# Patient Record
Sex: Female | Born: 1975 | Race: White | Hispanic: Yes | Marital: Married | Smoking: Never smoker
Health system: Southern US, Community
[De-identification: ages and names within clinical notes are randomized; demographics above are authoritative.]

## PROBLEM LIST (undated history)

## (undated) DIAGNOSIS — G43909 Migraine, unspecified, not intractable, without status migrainosus: Secondary | ICD-10-CM

## (undated) DIAGNOSIS — F419 Anxiety disorder, unspecified: Secondary | ICD-10-CM

## (undated) DIAGNOSIS — F329 Major depressive disorder, single episode, unspecified: Secondary | ICD-10-CM

## (undated) DIAGNOSIS — F32A Depression, unspecified: Secondary | ICD-10-CM

## (undated) HISTORY — DX: Anxiety disorder, unspecified: F41.9

## (undated) HISTORY — DX: Major depressive disorder, single episode, unspecified: F32.9

## (undated) HISTORY — DX: Migraine, unspecified, not intractable, without status migrainosus: G43.909

## (undated) HISTORY — DX: Depression, unspecified: F32.A

---

## 1999-05-26 ENCOUNTER — Other Ambulatory Visit: Admission: RE | Admit: 1999-05-26 | Discharge: 1999-05-26 | Payer: Self-pay | Admitting: Gynecology

## 2000-05-27 ENCOUNTER — Other Ambulatory Visit: Admission: RE | Admit: 2000-05-27 | Discharge: 2000-05-27 | Payer: Self-pay | Admitting: Gynecology

## 2001-06-10 ENCOUNTER — Other Ambulatory Visit: Admission: RE | Admit: 2001-06-10 | Discharge: 2001-06-10 | Payer: Self-pay | Admitting: Gynecology

## 2002-06-12 ENCOUNTER — Other Ambulatory Visit: Admission: RE | Admit: 2002-06-12 | Discharge: 2002-06-12 | Payer: Self-pay | Admitting: Gynecology

## 2003-08-19 ENCOUNTER — Other Ambulatory Visit: Admission: RE | Admit: 2003-08-19 | Discharge: 2003-08-19 | Payer: Self-pay | Admitting: Gynecology

## 2004-03-11 ENCOUNTER — Inpatient Hospital Stay (HOSPITAL_COMMUNITY): Admission: AD | Admit: 2004-03-11 | Discharge: 2004-03-13 | Payer: Self-pay | Admitting: Gynecology

## 2004-04-24 ENCOUNTER — Other Ambulatory Visit: Admission: RE | Admit: 2004-04-24 | Discharge: 2004-04-24 | Payer: Self-pay | Admitting: Gynecology

## 2005-05-03 ENCOUNTER — Other Ambulatory Visit: Admission: RE | Admit: 2005-05-03 | Discharge: 2005-05-03 | Payer: Self-pay | Admitting: Gynecology

## 2006-02-21 ENCOUNTER — Inpatient Hospital Stay (HOSPITAL_COMMUNITY): Admission: AD | Admit: 2006-02-21 | Discharge: 2006-02-23 | Payer: Self-pay | Admitting: Gynecology

## 2006-04-03 ENCOUNTER — Other Ambulatory Visit: Admission: RE | Admit: 2006-04-03 | Discharge: 2006-04-03 | Payer: Self-pay | Admitting: Gynecology

## 2007-04-08 ENCOUNTER — Other Ambulatory Visit: Admission: RE | Admit: 2007-04-08 | Discharge: 2007-04-08 | Payer: Self-pay | Admitting: Gynecology

## 2008-04-08 ENCOUNTER — Other Ambulatory Visit: Admission: RE | Admit: 2008-04-08 | Discharge: 2008-04-08 | Payer: Self-pay | Admitting: Gynecology

## 2008-04-08 ENCOUNTER — Encounter: Payer: Self-pay | Admitting: Gynecology

## 2008-04-08 ENCOUNTER — Ambulatory Visit: Payer: Self-pay | Admitting: Gynecology

## 2008-04-22 ENCOUNTER — Ambulatory Visit: Payer: Self-pay | Admitting: Gynecology

## 2008-05-07 ENCOUNTER — Ambulatory Visit: Payer: Self-pay | Admitting: Gynecology

## 2008-10-28 ENCOUNTER — Ambulatory Visit: Payer: Self-pay | Admitting: Gynecology

## 2009-04-15 ENCOUNTER — Ambulatory Visit: Payer: Self-pay | Admitting: Gynecology

## 2009-04-15 ENCOUNTER — Other Ambulatory Visit: Admission: RE | Admit: 2009-04-15 | Discharge: 2009-04-15 | Payer: Self-pay | Admitting: Gynecology

## 2009-04-22 ENCOUNTER — Ambulatory Visit: Payer: Self-pay | Admitting: Gynecology

## 2010-04-17 ENCOUNTER — Ambulatory Visit
Admission: RE | Admit: 2010-04-17 | Discharge: 2010-04-17 | Payer: Self-pay | Source: Home / Self Care | Attending: Gynecology | Admitting: Gynecology

## 2010-04-17 ENCOUNTER — Other Ambulatory Visit
Admission: RE | Admit: 2010-04-17 | Discharge: 2010-04-17 | Payer: Self-pay | Source: Home / Self Care | Admitting: Gynecology

## 2010-08-15 ENCOUNTER — Ambulatory Visit (INDEPENDENT_AMBULATORY_CARE_PROVIDER_SITE_OTHER): Payer: 59 | Admitting: Gynecology

## 2010-08-15 DIAGNOSIS — Z302 Encounter for sterilization: Secondary | ICD-10-CM

## 2010-08-15 DIAGNOSIS — Z3049 Encounter for surveillance of other contraceptives: Secondary | ICD-10-CM

## 2010-08-16 ENCOUNTER — Ambulatory Visit: Payer: Self-pay | Admitting: Gynecology

## 2010-08-25 NOTE — H&P (Signed)
[name], [name] NO.:  [id number]   MEDICAL RECORD NO.:  [id number]          PATIENT TYPE:  MAT   LOCATION:  [facility]                          FACILITY:  [facility]   PHYSICIAN:  Timothy P. Fontaine, M.D.DATE OF BIRTH:  05-Feb-1976   DATE OF ADMISSION:  02/21/2006  DATE OF DISCHARGE:                                HISTORY & PHYSICAL   CHIEF COMPLAINT:  Labor.   HISTORY OF PRESENT ILLNESS:  A 35 year old G75 P2 female at [redacted] weeks  gestation, who presents complaining of contractions.  Prenatal course has  been uncomplicated, other than she is a positive beta Streptococcus carrier.  For remainder of her history, see her [facility].   PHYSICAL EXAMINATION:  HEENT:  Normal.  LUNGS:  Clear.  CARDIAC:  Regular rate, without rubs, murmurs, or gallops.  ABDOMEN:  Gravid, vertex term uterus.  External monitor showed reactive  fetal tracing without decelerations.  Contractions every 5 minutes.  PELVIC:  Per nursing personnel, 80%, bulging bag, 4-5 cm dilated, posterior,  intact.   ASSESSMENT AND PLAN:  A 35 year old G71 P2 female, term gestation, labor.  Positive beta Streptococcus.   PLAN ON ADMISSION:  Antibiotic prophylaxis.  AROM.  Delivery.      Timothy P. Fontaine, M.D.  Electronically Signed     TPF/MEDQ  D:  02/21/2006  T:  02/21/2006  Job:  [id number]

## 2011-02-12 ENCOUNTER — Encounter: Payer: Self-pay | Admitting: Anesthesiology

## 2011-02-13 ENCOUNTER — Encounter: Payer: Self-pay | Admitting: Gynecology

## 2011-02-13 ENCOUNTER — Ambulatory Visit (INDEPENDENT_AMBULATORY_CARE_PROVIDER_SITE_OTHER): Payer: 59 | Admitting: Gynecology

## 2011-02-13 VITALS — BP 120/82

## 2011-02-13 DIAGNOSIS — N915 Oligomenorrhea, unspecified: Secondary | ICD-10-CM

## 2011-02-13 DIAGNOSIS — Z309 Encounter for contraceptive management, unspecified: Secondary | ICD-10-CM

## 2011-02-13 DIAGNOSIS — L299 Pruritus, unspecified: Secondary | ICD-10-CM

## 2011-02-13 DIAGNOSIS — T782XXA Anaphylactic shock, unspecified, initial encounter: Secondary | ICD-10-CM

## 2011-02-13 NOTE — Progress Notes (Signed)
The patient is a 35 year old gravida 3 para 3 who presented to the office today with irregular again her menstrual cycle and complaining of generalized pruritus especially in the arms or on the breast in her lower extremities which she stated started a month ago. She has been seen in the office on May 8 of this year and wanted to go on oral contraceptive pill since she was having mucus-like secretions when she had a Mirena IUD. She is no longer suffering from this mucus secretion vaginally. We had discussed several contraceptive options in the past include laparoscopic tubal ligation for which she was leaning towards proceeding with abdomen and antrum was going to try the oral contraceptive pill after she was counseled. 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 She started the Janel 120 in July 2 did for one month was waiting for her cycle to start did not take a pill at August the started in September 2 did for one month and had a. Early October and did not start the pill again and began having with a rash a month and a half ago.  Exam: Red erythematous papules were noted in the upper extremities and lower extremities and excoriated areas from scratching. No lesions were noted on the breast.  Assessment: Anaphylactic reaction to Janel 1/20 oral contraceptive pill and/or contact dermatitis from plastics she works with at work. She will stop the oral contraceptive pills. She scheduled to return January for her annual exam. I've given her literature information on the nonhormonal IUD as well as on laparoscopic sterilization. She will use barrier contraception. She will be placed on a 12 day course of steroid of 10 mg. She'll be started also on Vistaril consisting of 25 mg 3 times a day when necessary.

## 2011-02-14 ENCOUNTER — Telehealth: Payer: Self-pay | Admitting: *Deleted

## 2011-02-14 MED ORDER — HYDROXYZINE HCL 25 MG PO TABS: 25.0000 mg | ORAL_TABLET | Freq: Three times a day (TID) | ORAL | Status: AC | PRN

## 2011-02-14 NOTE — Telephone Encounter (Signed)
(  Last office visit 02/13/11) Pharmacy called regarding this pt has a rx for vistaril 25 mg 3 times a day. What amount of pills should pt have? The medication was not sent via e-scribe. Please advise

## 2011-02-14 NOTE — Telephone Encounter (Signed)
Spoke with pharmacy regarding the below medication. Placed medication in meds & orders

## 2011-02-14 NOTE — Telephone Encounter (Signed)
The patient was seen in the office yesterday and had an anaphylactic reaction from the [facility] 1/20 oral contraceptive pill. She was started on a 12 day steroid pack along with Vistaril 25 mg 3 times a day when necessary. Pharmacy called and not stated quantity of the Vistaril and this is to document that well as them to dispense 20 tablets with no refills.

## 2011-05-04 ENCOUNTER — Encounter: Payer: 59 | Admitting: Gynecology

## 2011-05-08 ENCOUNTER — Ambulatory Visit (INDEPENDENT_AMBULATORY_CARE_PROVIDER_SITE_OTHER): Payer: 59 | Admitting: Gynecology

## 2011-05-08 ENCOUNTER — Encounter: Payer: Self-pay | Admitting: Gynecology

## 2011-05-08 VITALS — BP 116/70 | Ht 61.5 in | Wt 141.0 lb

## 2011-05-08 DIAGNOSIS — Z01419 Encounter for gynecological examination (general) (routine) without abnormal findings: Secondary | ICD-10-CM

## 2011-05-08 DIAGNOSIS — R82998 Other abnormal findings in urine: Secondary | ICD-10-CM

## 2011-05-08 DIAGNOSIS — IMO0001 Reserved for inherently not codable concepts without codable children: Secondary | ICD-10-CM

## 2011-05-08 DIAGNOSIS — R635 Abnormal weight gain: Secondary | ICD-10-CM

## 2011-05-08 DIAGNOSIS — Z309 Encounter for contraceptive management, unspecified: Secondary | ICD-10-CM

## 2011-05-08 LAB — URINALYSIS W MICROSCOPIC + REFLEX CULTURE
Glucose, UA: NEGATIVE mg/dL
Leukocytes, UA: NEGATIVE
Nitrite: NEGATIVE
Protein, ur: NEGATIVE mg/dL

## 2011-05-08 LAB — CBC WITH DIFFERENTIAL/PLATELET
Basophils Relative: 0 % (ref 0–1)
Hemoglobin: 13.9 g/dL (ref 12.0–15.0)
Lymphs Abs: 1.6 10*3/uL (ref 0.7–4.0)
MCHC: 34.2 g/dL (ref 30.0–36.0)
Monocytes Relative: 9 % (ref 3–12)
Neutro Abs: 3.7 10*3/uL (ref 1.7–7.7)
Neutrophils Relative %: 62 % (ref 43–77)
Platelets: 207 10*3/uL (ref 150–400)
RBC: 4.69 MIL/uL (ref 3.87–5.11)

## 2011-05-08 LAB — CHOLESTEROL, TOTAL: Cholesterol: 217 mg/dL — ABNORMAL HIGH (ref 0–200)

## 2011-05-08 NOTE — Progress Notes (Signed)
Cheryl [name] [birth year] [id number]   History:    36 y.o.  for annual exam with the only complaining of occasional nonspecific dermatitis at the crease of her elbows and lower legs. She states that she works in a cold environment and periodically has to wear a layered clothing. Aveeno cream has worked well. Patient does her monthly self breast examination. She was weighing 139 is up to 141 with a BMI of 26.21. She is using condoms for contraception is having normal menstrual cycles.  Past medical history,surgical history, family history and social history were all reviewed and documented in the EPIC chart.  Gynecologic History Patient's last menstrual period was 04/29/2011. Contraception: condoms Last Pap: 2012. Results were: normal Last mammogram: No prior study. Results were: No prior study  Obstetric History OB History    Grav Para Term Preterm Abortions TAB SAB Ect Mult Living   3 3 3       3      # Outc Date GA Lbr Len/2nd Wgt Sex Del Anes PTL Lv   1 TRM     F SVD   Yes   2 TRM     M SVD   Yes   3 TRM     M SVD   Yes       ROS:  Was performed and pertinent positives and negatives are included in the history.  Exam: chaperone present  BP 116/70  Ht 5' 1.5" (1.562 m)  Wt 141 lb (63.957 kg)  BMI 26.21 kg/m2  LMP 04/29/2011  Body mass index is 26.21 kg/(m^2).  General appearance : Well developed well nourished female. No acute distress HEENT: Neck supple, trachea midline, no carotid bruits, no thyroidmegaly Lungs: Clear to auscultation, no rhonchi or wheezes, or rib retractions  Heart: Regular rate and rhythm, no murmurs or gallops Breast:Examined in sitting and supine position were symmetrical in appearance, no palpable masses or tenderness,  no skin retraction, no nipple inversion, no nipple discharge, no skin discoloration, no axillary or supraclavicular lymphadenopathy Abdomen: no palpable masses or tenderness, no rebound or guarding Extremities: no edema or skin  discoloration or tenderness  Pelvic:  Bartholin, Urethra, Skene Glands: Within normal limits             Vagina: No gross lesions or discharge  Cervix: No gross lesions or discharge  Uterus  anteverted, normal size, shape and consistency, non-tender and mobile  Adnexa  Without masses or tenderness  Anus and perineum  normal   Rectovaginal  normal sphincter tone without palpated masses or tenderness             Hemoccult not done     Assessment/Plan:  36 y.o. female for annual exam unremarkable. Literature information on diet and exercise provided in Spanish. The following labs will be drawn: CBC, cholesterol, blood sugar, TSH, urinalysis. New screening guidelines for Pap smear discussed. No Pap smear done today.    Ok Edwards MD, 3:50 PM 05/08/2011

## 2011-05-08 NOTE — Patient Instructions (Signed)
Control del colesterol  Los niveles de colesterol en el organismo estn determinados significativamente por su dieta. Los niveles de colesterol tambin se relacionan con la enfermedad cardaca. El material que sigue ayuda a explicar esta relacin y a analizar qu puede hacer para mantener su corazn sano. No todo el colesterol es malo. Las lipoprotenas de baja densidad (LDL) forman el colesterol "malo". El colesterol malo puede ocasionar depsitos de grasa que se acumulan en el interior de las arterias. Las lipoprotenas de alta densidad (HDL) es el colesterol "bueno". Ayuda a remover el colesterol LDL "malo" de la sangre. El colesterol es un factor de riesgo muy importante para la enfermedad cardaca. Otros factores de riesgo son la hipertensin arterial, el hbito de fumar, el estrs, la herencia y el peso.   El msculo cardaco obtiene el suministro de sangre a travs de las arterias coronarias. Si su colesterol LDL ("malo") est elevado y el HDL ("bueno") es bajo, tiene un factor de riesgo para que se formen depsitos de grasa en las arterias coronarias (los vasos sanguneos que suministran sangre al corazn). Esto hace que haya menos lugar para que la sangre circule. Sin la suficiente sangre y oxgeno, el msculo cardaco no puede funcionar correctamente, y usted podr sentir dolores en el pecho (angina pectoris). Cuando una arteria coronaria se cierra completamente, una parte del msculo cardaco puede morir (infarto de miocardio).  CONTROL DEL COLESTEROL Cuando el profesional que lo asiste enva la sangre al laboratorio para conocer el nivel de colesterol, puede realizarle tambin un perfil completo de los lpidos. Con esta prueba, se puede determinar la cantidad total de colesterol, as como los niveles de LDL y HDL. Los triglicridos son un tipo de grasa que circula en la sangre y que tambin puede utilizarse para determinar el riesgo de enfermedad  cardaca. En la siguiente tabla se establecen los nmeros ideales: Prueba: Colesterol total  Menos de 200 mg/dl.  Prueba: LDL "colesterol malo"  Menos de 100 mg/dl.   Menos de 70 mg/dl si tiene riesgo muy elevado de sufrir un ataque cardaco o muerte cardaca sbita.  Prueba: HDL "colesterol bueno"  Mujeres: Ms de 50 mg/dl.   Hombres: Ms de 40 mg/dl.  Prueba: Trigliceridos  Menos de 150 mg/dl.    CONTROL DEL COLESTEROL CON DIETA Aunque factores como el ejercicio y el estilo de vida son importantes, la "primera lnea de ataque" es la dieta. Esto se debe a que se sabe que ciertos alimentos hacen subir el colesterol y otros lo bajan. El objetivo debe ser equilibrar los alimentos, de modo que tengan un efecto sobre el colesterol y, an ms importante, reemplazar las grasas saturadas y trans con otros tipos de grasas, como las monoinsaturadas y las poliinsaturadas y cidos grasos omega-3 . En promedio, una persona no debe consumir ms de 15 a 17 g de grasas saturadas por da. Las grasas saturadas y trans se consideran grasas "malas", ya que elevan el colesterol LDL. Las grasas saturadas se encuentran principalmente en productos animales como carne, manteca y crema. Pero esto no significa que usted debe sacrificar todas sus comidas favoritas. Actualmente, como lo muestra el cuadro que figura al final de este documento, hay sustitutos de buen sabor, bajos en grasas y en colesterol, para la mayora de los alimentos que a usted le gusta comer. Elija aquellos alimentos alternativos que sean bajos en grasas o sin grasas. Elija cortes de carne del cuarto trasero o lomo ya que estos cortes son los que tienen menor cantidad de grasa   y colesterol. El pollo (sin piel), el pescado, la carne de ternera, y la pechuga de pavo molida son excelentes opciones. Elimine las carnes grasosas como los hotdogs o el salami. Los mariscos tienen poco o nada de grasas saturadas. Cuando consuma carne magra, carne de aves de  corral, o pescado, hgalo en porciones de 85 gramos (3 onzas). Las grasas trans tambin se llaman "aceites parcialmente hidrogenados". Son aceites manipulados cientficamente de modo que son slidos a temperatura ambiente, tienen una larga vida y mejoran el sabor y la textura de los alimentos a los que se agregan. Las grasas trans se encuentran en la margarina, masitas, crackers y alimentos horneados.  Para hornear y cocinar, el aceite es un excelente sustituto para la mantequilla. Los aceites monoinsaturados tienen un beneficio particular, ya que se cree que disminuyen el colesterol LDL (colesterol malo) y elevan el HDL. Deber evitar los aceites tropicales saturados como el de coco y el de palma.  Recuerde, adems, que puede comer sin restricciones los grupos de alimentos que son naturalmente libres de grasas saturadas y grasas trans, entre los que se incluyen el pescado, las frutas (excepto el aguacate), verduras, frijoles, cereales (cebada, arroz, cuzcuz, trigo) y las pastas (sin salsas con crema)   IDENTIFIQUE LOS ALIMENTOS QUE DISMINUYEN EL COLESTEROL  Pueden disminuir el colesterol las fibras solubles que estn en las frutas, como las manzanas, en los vegetales como el brcoli, las patatas y las zanahorias; en las legumbres como frijoles, guisantes y lentejas; y en los cereales como la cebada. Los alimentos fortificados con fitosteroles tambin pueden disminuir el colesterol. Debe consumir al menos 2 g de estos alimentos a diario para obtener el efecto de disminucin de colesterol.  En el supermercado, lea las etiquetas de los envases para identificar los alimentos bajos en grasas saturadas, libres de grasas trans y bajos en grasas, . Elija quesos que tengan solo de 2 a 3 g de grasa saturada por onza (28,35 g). Use una margarina que no dae el corazn, libre de grasas trans o aceite parcialmente hidrogenado. Al comprar alimentos horneados (galletitas dulces y galletas) evite el aceite parcialmente  hidrogenado. Los panes y bollos debern ser de granos enteros (harina de maz o de avena entera, en lugar de "harina" o "harina enriquecida"). Compre sopas en lata que no sean cremosas, con bajo contenido de sal y sin grasas adicionadas.   TCNICAS DE PREPARACIN DE LOS ALIMENTOS  Nunca fra los alimentos en aceite abundante. Si debe frer, hgalo en poco aceite y removiendo constantemente, porque as se utilizan muy pocas grasas, o utilice un spray antiadherente. Cuando le sea posible, hierva, hornee o ase las carnes y cocine los vegetales al vapor. En vez de aderezar los vegetales con mantequilla o margarina, utilice limn y hierbas, pur de manzanas y canela (para las calabazas y batatas), yogurt y salsa descremados y aderezos para ensaladas bajos en contenido graso.   BAJO EN GRASAS SATURADAS / SUSTITUTOS BAJOS EN GRASA  Carnes / Grasas saturadas (g)  Evite: Bife, corte graso (3 oz/85 g) / 11 g   Elija: Bife, corte magro (3 oz/85 g) / 4 g   Evite: Hamburguesa (3 oz/85 g) / 7 g   Elija:  [place]amburguesa magra (3 oz/85 g) / 5 g   Evite: Jamn (3 oz/85 g) / 6 g   Elija:  Jamn magro (3 oz/85 g) / 2.4 g   Evite: Pollo, con piel (3 oz/85 g), Carne oscura / 4 g   Elija:  Pollo, sin piel (  3 oz/85 g), Carne oscura / 2 g   Evite: Pollo, con piel (3 oz/85 g), Carne magra / 2.5 g   Elija: Pollo, sin piel (3 oz/85 g), Carne magra / 1 g  Lcteos / Grasas saturadas (g)  Evite: Leche entera (1 taza) / 5 g   Elija: Leche con bajo contenido de grasa, 2% (1 taza) / 3 g   Elija: Leche con bajo contenido de grasa, 1% (1 taza) / 1.5 g   Elija: Leche descremada (1 taza) / 0.3 g   Evite: Queso duro (1 oz/28 g) / 6 g   Elija: Queso descremado (1 oz/28 g) / 2-3 g   Evite: Queso cottage, 4% grasa (1 taza)/ 6.5 g   Elija: Queso cottage con bajo contenido de grasa, 1% grasa (1 taza)/ 1.5 g   Evite: Helado (1 taza) / 9 g   Elija: Sorbete (1 taza) / 2.5 g   Elija: Yogurt helado sin contenido de  grasa (1 taza) / 0.3 g   Elija: Barras de fruta congeladas / vestigios   Evite: Crema batida (1 cucharada) / 3.5 g   Elija: Batidos glac sin lcteos (1 cucharada) / 1 g  Condimentos / Grasas saturadas (g)  Evite: Mayonesa (1 cucharada) / 2 g   Elija: Mayonesa con bajo contenido de grasa (1 cucharada) / 1 g   Evite: Manteca (1 cucharada) / 7 g   Elija: Margarina extra light (1 cucharada) / 1 g   Evite: Aceite de coco (1 cucharada) / 11.8 g   Elija: Aceite de oliva (1 cucharada) / 1.8 g   Elija: Aceite de maz (1 cucharada) / 1.7 g   Elija: Aceite de crtamo (1 cucharada) / 1.2 g   Elija: Aceite de girasol (1 cucharada) / 1.4 g   Elija: Aceite de soja (1 cucharada) / 2.4 g   Elija: Aceite de canola (1 cucharada) / 1 g  Document Released: 03/26/2005 Document Revised: 12/06/2010 [employer] Patient Information 2012 [employer]. Ejercicios para perder peso (Exercise to Lose Weight) La actividad fsica y una dieta saludable ayudan a perder peso. El mdico podr sugerirle ejercicios especficos. IDEAS Y CONSEJOS PARA HACER EJERCICIOS  Elija opciones econmicas que disfrute hacer , como caminar, andar en bicicleta o los vdeos para ejercitarse.   Utilice las escaleras en lugar del ascensor.   Camine durante la hora del almuerzo.   Estacione el auto lejos del lugar de trabajo o estudio.   Concurra a un gimnasio o tome clases de gimnasia.   Comience con 5  10 minutos de actividad fsica por da. Ejercite hasta 30 minutos, 4 a 6 das por semana.   Utilice zapatos que tengan un buen soporte y ropas cmodas.   Elongue antes y despus de ejercitar.   Ejercite hasta que aumente la respiracin y el corazn palpite rpido.   Beba agua extra cuando ejercite.   No haga ejercicio hasta lastimarse, sentirse mareado o que le falte mucho el aire.  La actividad fsica puede quemar alrededor de 150 caloras.  Correr 20 cuadras en 15 minutos.   Jugar vley durante 45 a 60  minutos.   Limpiar y encerar el auto durante 45 a 60 minutos.   Jugar ftbol americano de toque.   Caminar 25 cuadras en 35 minutos.   Empujar un cochecito 20 cuadras en 30 minutos.   Jugar baloncesto durante 30 minutos.   Rastrillar hojas secas durante 30 minutos.   Andar en bicicleta 80 cuadras en 30 minutos.     Caminar 30 cuadras en 30 minutos.   Bailar durante 30 minutos.   Quitar la nieve con una pala durante 15 minutos.   Nadar vigorosamente durante 20 minutos.   Subir escaleras durante 15 minutos.   Andar en bicicleta 60 cuadras durante 15 minutos.   Arreglar el jardn entre 30 y 45 minutos.   Saltar a la soga durante 15 minutos.   Limpiar vidrios o pisos durante 45 a 60 minutos.  Document Released: 06/30/2010 Document Revised: 12/06/2010 [employer] Patient Information 2012 [employer]. 

## 2011-05-08 NOTE — Progress Notes (Signed)
Addended by: Richardson Chiquito on: 05/08/2011 04:55 PM   Modules accepted: Orders

## 2011-05-10 LAB — URINE CULTURE: Organism ID, Bacteria: NO GROWTH

## 2011-05-23 ENCOUNTER — Other Ambulatory Visit: Payer: 59

## 2011-05-23 ENCOUNTER — Other Ambulatory Visit: Payer: Self-pay | Admitting: Anesthesiology

## 2011-05-23 ENCOUNTER — Other Ambulatory Visit: Payer: Self-pay | Admitting: Gynecology

## 2011-05-23 DIAGNOSIS — E7800 Pure hypercholesterolemia, unspecified: Secondary | ICD-10-CM

## 2011-05-23 LAB — LIPID PANEL
Cholesterol: 209 mg/dL — ABNORMAL HIGH (ref 0–200)
HDL: 65 mg/dL (ref >39)
Total CHOL/HDL Ratio: 3.2 Ratio
Triglycerides: 57 mg/dL (ref <150)

## 2011-05-23 LAB — URINALYSIS W MICROSCOPIC + REFLEX CULTURE
Bilirubin Urine: NEGATIVE
Casts: NONE SEEN
Glucose, UA: NEGATIVE mg/dL
Hgb urine dipstick: NEGATIVE
Ketones, ur: NEGATIVE mg/dL
Protein, ur: NEGATIVE mg/dL
RBC / HPF: NONE SEEN RBC/hpf (ref <3)
pH: 6.5 (ref 5.0–8.0)

## 2011-05-24 LAB — URINE CULTURE
Colony Count: NO GROWTH
Organism ID, Bacteria: NO GROWTH

## 2012-05-09 ENCOUNTER — Encounter: Payer: Self-pay | Admitting: Gynecology

## 2012-05-09 ENCOUNTER — Ambulatory Visit (INDEPENDENT_AMBULATORY_CARE_PROVIDER_SITE_OTHER): Payer: 59 | Admitting: Gynecology

## 2012-05-09 VITALS — BP 122/80 | Ht 61.5 in | Wt 142.0 lb

## 2012-05-09 DIAGNOSIS — G43829 Menstrual migraine, not intractable, without status migrainosus: Secondary | ICD-10-CM

## 2012-05-09 DIAGNOSIS — R635 Abnormal weight gain: Secondary | ICD-10-CM | POA: Insufficient documentation

## 2012-05-09 DIAGNOSIS — Z01419 Encounter for gynecological examination (general) (routine) without abnormal findings: Secondary | ICD-10-CM

## 2012-05-09 LAB — TSH: TSH: 0.698 u[IU]/mL (ref 0.350–4.500)

## 2012-05-09 LAB — CBC WITH DIFFERENTIAL/PLATELET
Basophils Absolute: 0 10*3/uL (ref 0.0–0.1)
Basophils Relative: 0 % (ref 0–1)
Eosinophils Absolute: 0.1 10*3/uL (ref 0.0–0.7)
MCH: 29 pg (ref 26.0–34.0)
MCHC: 34.5 g/dL (ref 30.0–36.0)
Monocytes Absolute: 0.6 10*3/uL (ref 0.1–1.0)
Monocytes Relative: 8 % (ref 3–12)
Neutro Abs: 4.7 10*3/uL (ref 1.7–7.7)
Neutrophils Relative %: 67 % (ref 43–77)
RDW: 13.6 % (ref 11.5–15.5)

## 2012-05-09 LAB — HEMOGLOBIN A1C: Hgb A1c MFr Bld: 5.5 % (ref <5.7)

## 2012-05-09 LAB — CHOLESTEROL, TOTAL: Cholesterol: 206 mg/dL — ABNORMAL HIGH (ref 0–200)

## 2012-05-09 MED ORDER — ESTRADIOL 0.075 MG/24HR TD PTTW: MEDICATED_PATCH | TRANSDERMAL | Status: DC

## 2012-05-09 MED ORDER — ESTRADIOL 0.0375 MG/24HR TD PTTW: MEDICATED_PATCH | TRANSDERMAL | Status: DC

## 2012-05-09 NOTE — Patient Instructions (Addendum)
Cefalea migraosa (Migraine Headache)  Cheryl [name] es un dolor intenso y punzante en uno o ambos lados de la cabeza. Puede durar desde 30 minutos hasta varias horas.  CAUSAS  No siempre se conoce la causa exacta. Sin embargo, IT consultant [employer] nervios del cerebro se irritan y liberan ciertas sustancias qumicas que causan inflamacin. Esto ocasiona dolor.  SNTOMAS   Dolor en uno o ambos lados de la cabeza.  Dolor punzante o con pulsaciones.  Dolor que es lo suficientemente grave en intensidad como para impedir las actividades habituales.  Se agrava por cualquier actividad fsica habitual.  Nuseas, vmitos o ambos.  Mareos.  Dolor ante la exposicin a luces brillantes o a ruidos fuertes o con Agricultural engineer.  Sensibilidad general a las luces brillantes, a los ruidos fuertes o a los [employer]. Antes de sentir a Cheryl [name], puede recibir seales de advertencia de que est por aparecer (aura). Un aura puede incluir:   Visin de luces intermitentes.  Visin de puntos brillantes, halos o lneas en zigzag.  Visin en tnel o visin borrosa.  Sensacin de entumecimiento u hormigueo.  Tener dificultad para hablar.  Tener debilidad muscular. DISPARADORES DE LA CEFALEA MIGRAOSA  Beber alcohol.  El hbito de fumar.  El estrs.  Con la menstruacin.  Quesos estacionados.  Alimentos o bebidas que contienen nitratos, glutamato, aspartamo o tiramina.  La falta de sueo.  Chocolate.  Cafena.  Hambre.  Con una actividad fsica extenuante.  Fatiga.  Los medicamentos utilizados para tratar Aeronautical engineer (nitroglicerina), las pldoras anticonceptivas, los estrgenos y algunos medicamentos para la hipertensin pueden provocarla. DIAGNSTICO Una cefalea migraosa a menudo se diagnostica segn:  Sntomas.  Examen fsico.  Neomia Dear TC (tomografa computada) o resonancia magntica de la cabeza. TRATAMIENTO Le prescribirn medicamentos para Engineer, materials y  las nuseas. Tambin podrn administrarse medicamentos para ayudar a Armed forces training and education officer.  INSTRUCCIONES PARA EL CUIDADO EN EL HOGAR   Slo tome medicamentos de venta libre o recetados para Primary school teacher o Environmental health practitioner, segn las indicaciones de su mdico. No se recomienda usar analgsicos narcticos a Air cabin crew.  Acustese en un cuarto oscuro y tranquilo cuando tiene [place].  Lleve un registro diario para Financial risk analyst lo que puede provocar dolores de cabeza por la [place]. Por ejemplo, escriba:  Lo que come y bebe.  Cunto tiempo duerme.  Todo cambio en la dieta o medicamentos.  Limite el consumo de bebidas alcohlicas.  Si fuma, deje de hacerlo.  Duerma entre 7 y 9 horas o como lo indique su mdico.  Limite las situaciones de Librarian, academic.  Mantenga las luces tenues si le [employer] luces brillantes y la [place]. SOLICITE ATENCIN MDICA DE INMEDIATO SI:   La migraa se hace cada vez ms intensa.  Tiene fiebre.  Presenta rigidez en el cuello.  Tiene prdida de visin.  Presenta debilidad muscular o prdida del control muscular.  Comienza a perder el equilibrio o tiene problemas para caminar.  Sufre mareos o se desmaya.  Tiene sntomas graves que son diferentes a los primeros sntomas. ASEGRESE QUE:   Comprende estas instrucciones.  Controlar su enfermedad.  Solicitar ayuda de inmediato si no mejora o empeora. Document Released: 03/26/2005 Document Revised: 06/18/2011 [facility] Patient Information 2013 [place], [place].  Control del colesterol  Los niveles de colesterol en el organismo estn determinados significativamente por su dieta. Los niveles de colesterol tambin se relacionan con la enfermedad cardaca. El material que sigue ayuda a Software engineer relacin y a Chiropractor qu puede hacer para mantener su corazn sano. No todo el colesterol es [place]. Las lipoprotenas de baja densidad (LDL)  forman el colesterol "malo". El colesterol malo puede ocasionar depsitos de grasa que se acumulan en el interior de las arterias. Las lipoprotenas de alta densidad (HDL) es el colesterol "bueno". Ayuda a remover el colesterol LDL "malo" de la [place]. El colesterol es un factor de riesgo muy importante para la enfermedad cardaca. Otros factores de riesgo son la hipertensin arterial, el hbito de fumar, el estrs, la herencia y [place].  El msculo cardaco obtiene el suministro de sangre a travs de las arterias coronarias. Si su colesterol LDL ("malo") est elevado y el HDL ("bueno") es bajo, tiene un factor de riesgo para que se formen depsitos de Holiday representative en las arterias coronarias (los vasos sanguneos que suministran sangre al corazn). Esto hace que haya menos lugar para que la sangre circule. Sin la suficiente sangre y oxgeno, el msculo cardaco no puede funcionar correctamente, y usted podr sentir dolores en el pecho (angina pectoris). Cuando una arteria coronaria se cierra completamente, una parte del msculo cardaco puede morir (infarto de miocardio). CONTROL DEL COLESTEROL Cuando el profesional que lo asiste enva la sangre al laboratorio para Artist nivel de colesterol, puede realizarle tambin un perfil completo de los lpidos. Con esta prueba, se puede determinar la cantidad total de colesterol, as como los niveles de LDL y HDL. Los triglicridos son un tipo de grasa que circula en la sangre y que tambin puede utilizarse para determinar el riesgo de enfermedad cardaca. En la siguiente tabla se establecen los nmeros ideales: Prueba: Colesterol total  Menos de 200 mg/dl.  Prueba: LDL "colesterol malo"  Menos de 100 mg/dl.   Menos de 70 mg/dl si tiene riesgo muy elevado de sufrir un ataque cardaco o muerte cardaca sbita.  Prueba: HDL "colesterol bueno"  Mujeres: Ms de 50 mg/dl.   Hombres: Ms de 40 mg/dl.  Prueba: Trigliceridos  Menos de 150 mg/dl.  CONTROL DEL  COLESTEROL CON DIETA Aunque factores como el ejercicio y el estilo de vida son importantes, la "primera lnea de ataque" es la dieta. Esto se debe a que se sabe que ciertos alimentos hacen subir el colesterol y otros lo [place]. El objetivo debe ser [employer] alimentos, de modo que tengan un efecto sobre el colesterol y, an ms importante, Microbiologist las grasas saturadas y trans con otros tipos de grasas, como las monoinsaturadas y las poliinsaturadas y cidos grasos omega-3 . En promedio, una persona no debe consumir ms de 15 a 17 g de grasas saturadas por [employer]. Las grasas saturadas y trans se consideran grasas "malas", ya que elevan el colesterol LDL. Las grasas saturadas se encuentran principalmente en productos animales como carne, [place] y crema. Pero esto no significa que usted Marketing executive todas sus comidas favoritas. Actualmente, como lo muestra el cuadro que figura al final de este documento, hay sustitutos de buen sabor, bajos en grasas y en colesterol, para la mayora de los alimentos que a usted Musician. Elija aquellos alimentos alternativos que sean bajos en grasas o sin grasas. Elija cortes de carne del cuarto trasero o lomo ya que estos cortes son los que tienen menor cantidad de grasa y Oncologist. El  pollo (sin piel), el pescado, la carne de ternera, y la [place] molida son excelentes opciones. Elimine las carnes [employer] Foods o el salami. Los [employer] o nada de grasas saturadas. Cuando consuma carne [place], carne de aves de corral, o pescado, hgalo en porciones de 85 gramos (3 onzas). Las grasas trans tambin se llaman "aceites parcialmente hidrogenados". Son aceites manipulados cientficamente de [place] que son slidos a Publishing rights manager, tienen una larga vida y Glass blower/designer sabor y la textura de los alimentos a los que se Scientist, clinical (histocompatibility and immunogenetics). Las grasas trans se encuentran en la [place], [place], crackers y alimentos horneados.  Para hornear y cocinar, el  aceite es un excelente sustituto para la [place]. Los aceites monoinsaturados tienen un beneficio particular, ya que se cree que disminuyen el colesterol LDL (colesterol malo) y elevan el HDL. Deber evitar los aceites tropicales saturados como el de coco y el de [place].  Recuerde, adems, que puede comer sin restricciones los grupos de alimentos que son naturalmente libres de grasas saturadas y Neurosurgeon trans, entre los que se incluyen el pescado, las frutas (excepto el [place]), verduras, frijoles, cereales (cebada, arroz, [place], trigo) y las pastas (sin salsas con crema)  IDENTIFIQUE LOS ALIMENTOS QUE DISMINUYEN EL COLESTEROL  Pueden disminuir el colesterol las fibras solubles que estn en las frutas, como las [place], en los vegetales como el brcoli, las patatas y las zanahorias; en las legumbres como frijoles, guisantes y Therapist, occupational; y en los cereales como la cebada. Los alimentos fortificados con fitosteroles tambin Engineer, production. Debe consumir al menos 2 g de estos alimentos a diario para Financial planner de disminucin de [place].  En el supermercado, lea las etiquetas de los envases para identificar los alimentos bajos en grasas saturadas, libres de grasas trans y bajos en [place], . Elija quesos que tengan solo de 2 a 3 g de grasa saturada por onza (28,35 g). Use una margarina que no dae el corazn, Pioneer de grasas trans o aceite parcialmente hidrogenado. Al comprar alimentos horneados (galletitas dulces y Gaffer) evite el aceite parcialmente hidrogenado. Los panes y bollos debern ser de granos enteros (harina de maz o de avena entera, en lugar de "harina" o "harina enriquecida"). Compre sopas en lata que no sean cremosas, con bajo contenido de sal y sin grasas adicionadas.  TCNICAS DE PREPARACIN DE LOS ALIMENTOS  Nunca fra los alimentos en aceite abundante. Si debe frer, hgalo en poco aceite y removiendo [place], porque as se utilizan muy pocas grasas, o  utilice un spray antiadherente. Cuando le sea posible, hierva, hornee o ase las carnes y cocine los vegetales al vapor. En vez de [employer] con mantequilla o [place], utilice limn y hierbas, pur de Psychologist, educational y canela (para las calabazas y batatas), yogurt y salsa descremados y aderezos para ensaladas bajos en contenido graso.  BAJO EN GRASAS SATURADAS / SUSTITUTOS BAJOS EN GRASA  Carnes / Grasas saturadas (g)  Evite: Bife, corte graso (3 oz/85 g) / 11 g   Elija: Bife, corte magro (3 oz/85 g) / 4 g   Evite: [place]uesa (3 oz/85 g) / 7 g   Elija:  [place] magra (3 oz/85 g) / 5 g   Evite: Jamn (3 oz/85 g) / 6 g   Elija:  Jamn magro (3 oz/85 g) / 2.4 g   Evite: Pollo, con piel (3 oz/85 g), Carne oscura / 4 g   Elija:  Pollo, sin piel (3 oz/85 g), Beatrix Fetters /  2 g   Evite: Pollo, con piel (3 oz/85 g), Carne magra / 2.5 g   Elija: Pollo, sin piel (3 oz/85 g), Carne magra / 1 g  Lcteos / Grasas saturadas (g)  Evite: Leche entera (1 taza) / 5 g   Elija: Leche con bajo contenido de grasa, 2% (1 taza) / 3 g   Elija: Leche con bajo contenido de grasa, 1% (1 taza) / 1.5 g   Elija: Leche descremada (1 taza) / 0.3 g   Evite: Queso duro (1 oz/28 g) / 6 g   Elija: Queso descremado (1 oz/28 g) / 2-3 g   Evite: Queso cottage, 4% grasa (1 taza)/ 6.5 g   Elija: Queso cottage con bajo contenido de grasa, 1% grasa (1 taza)/ 1.5 g   Evite: Helado (1 taza) / 9 g   Elija: Sorbete (1 taza) / 2.5 g   Elija: Yogurt helado sin contenido de grasa (1 taza) / 0.3 g   Elija: Barras de fruta congeladas / vestigios   Evite: Crema batida (1 cucharada) / 3.5 g   Elija: Batidos glac sin lcteos (1 cucharada) / 1 g  Condimentos / Grasas saturadas (g)  Evite: Mayonesa (1 cucharada) / 2 g   Elija: Mayonesa con bajo contenido de grasa (1 cucharada) / 1 g   Evite: [place] (1 cucharada) / 7 g   Elija: Margarina extra light (1 cucharada) / 1 g   Evite: Aceite de coco (1  cucharada) / 11.8 g   Elija: Aceite de oliva (1 cucharada) / 1.8 g   Elija: Aceite de maz (1 cucharada) / 1.7 g   Elija: Aceite de crtamo (1 cucharada) / 1.2 g   Elija: Aceite de girasol (1 cucharada) / 1.4 g   Elija: Aceite de soja (1 cucharada) / 2.4 g   Elija: Aceite de canola (1 cucharada) / 1 g  Document Released: 03/26/2005 Document Revised: 12/06/2010 [facility] Patient Information 2012 [place], [place].  Ejercicios para perder peso (Exercise to Lose Weight) La actividad fsica y Neomia Dear dieta saludable ayudan a perder peso. El mdico podr sugerirle ejercicios especficos. IDEAS Y CONSEJOS PARA HACER EJERCICIOS  Elija opciones econmicas que disfrute hacer , como caminar, andar en bicicleta o los vdeos para ejercitarse.   Utilice las Microbiologist del ascensor.   Camine durante la hora del almuerzo.   Estacione el auto lejos del lugar de [place] o [place].   Concurra a un gimnasio o tome clases de gimnasia.   Comience con 5  10 minutos de actividad fsica por da. Ejercite hasta 30 minutos, 4 a 6 das por [address]   Utilice zapatos que tengan un buen soporte y ropas cmodas.   Elongue antes y despus de Company secretary.   Ejercite hasta que aumente la respiracin y el corazn palpite rpido.   Beba agua extra cuando ejercite.   No haga ejercicio Firefighter, sentirse mareado o que le falte mucho el aire.  La actividad fsica puede quemar alrededor de 150 caloras.  Correr 20 cuadras en 15 minutos.   Jugar vley durante 45 a 60 minutos.   Limpiar y encerar el auto durante 45 a 60 minutos.   Jugar ftbol americano de toque.   Caminar 25 cuadras en 35 minutos.   Empujar un cochecito 20 cuadras en 30 minutos.   Jugar baloncesto durante 30 minutos.   Rastrillar hojas secas durante 30 minutos.   Andar en bicicleta 80 cuadras en 30 minutos.   Caminar 30 cuadras en 30  minutos.   Bailar durante 30 minutos.   Quitar la nieve con una pala durante 15  minutos.   Nadar vigorosamente durante 20 minutos.   Subir escaleras durante 15 minutos.   Andar en bicicleta 60 cuadras durante 15 minutos.   Arreglar el jardn entre 30 y 45 minutos.   Saltar a la soga durante 15 minutos.   Limpiar vidrios o pisos durante 45 a 60 minutos.  Document Released: 06/30/2010 Document Revised: 12/06/2010 [facility] Patient Information 2012 [place], [place].

## 2012-05-09 NOTE — Progress Notes (Signed)
Cheryl [name] March 29, 1976 [id number]   History:    37 y.o.  for annual gyn exam who states that for the past 6 months she's been complaining of headaches 3 or 4 days before her menses. Several years ago for her menstrual migraines she had been placed on Vivelle-Dot patch which she worked tremendously for headaches. She's currently using barrier contraception. She is doing her monthly self breast examination. She reports her cycles been regular. Patient would know prior history of abnormal Pap smears. Patient flu vaccine and Tdap vaccine are up-to-date.  Past medical history,surgical history, family history and social history were all reviewed and documented in the EPIC chart.  Gynecologic History Patient's last menstrual period was 04/12/2012. Contraception: coitus interruptus Last Pap: 2012. Results were: normal Last mammogram: Not indicated. Results were: Not indicated  Obstetric History OB History    Grav Para Term Preterm Abortions TAB SAB Ect Mult Living   3 3 3       3      # Outc Date GA Lbr Len/2nd Wgt Sex Del Anes PTL Lv   1 TRM     F SVD   Yes   2 TRM     M SVD   Yes   3 TRM     M SVD   Yes       ROS: A ROS was performed and pertinent positives and negatives are included in the history.  GENERAL: No fevers or chills. HEENT: No change in vision, no earache, sore throat or sinus congestion. NECK: No pain or stiffness. CARDIOVASCULAR: No chest pain or pressure. No palpitations. PULMONARY: No shortness of breath, cough or wheeze. GASTROINTESTINAL: No abdominal pain, nausea, vomiting or diarrhea, melena or bright red blood per rectum. GENITOURINARY: No urinary frequency, urgency, hesitancy or dysuria. MUSCULOSKELETAL: No joint or muscle pain, no back pain, no recent trauma. DERMATOLOGIC: No rash, no itching, no lesions. ENDOCRINE: No polyuria, polydipsia, no heat or cold intolerance. No recent change in weight. HEMATOLOGICAL: No anemia or easy bruising or bleeding. NEUROLOGIC: No  headache, seizures, numbness, tingling or weakness. PSYCHIATRIC: No depression, no loss of interest in normal activity or change in sleep pattern.     Exam: chaperone present  BP 122/80  Ht 5' 1.5" (1.562 m)  Wt 142 lb (64.411 kg)  BMI 26.40 kg/m2  LMP 04/12/2012  Body mass index is 26.40 kg/(m^2).  General appearance : Well developed well nourished female. No acute distress HEENT: Neck supple, trachea midline, no carotid bruits, no thyroidmegaly Lungs: Clear to auscultation, no rhonchi or wheezes, or rib retractions  Heart: Regular rate and rhythm, no murmurs or gallops Breast:Examined in sitting and supine position were symmetrical in appearance, no palpable masses or tenderness,  no skin retraction, no nipple inversion, no nipple discharge, no skin discoloration, no axillary or supraclavicular lymphadenopathy Abdomen: no palpable masses or tenderness, no rebound or guarding Extremities: no edema or skin discoloration or tenderness  Pelvic:  Bartholin, Urethra, Skene Glands: Within normal limits             Vagina: No gross lesions or discharge  Cervix: No gross lesions or discharge  Uterus  anteverted, normal size, shape and consistency, non-tender and mobile  Adnexa  Without masses or tenderness  Anus and perineum  normal   Rectovaginal  normal sphincter tone without palpated masses or tenderness             Hemoccult not indicated     Assessment/Plan:  37 y.o. female for annual exam  with clinical evidence of menstrual migraines based on history as well. Patient responded well with a transdermal estrogen in the past. She will be placed on estradiol (Minivelle) 0.0375 mg/24-hour to apply to lower abdomen once a month 3-4 days before her menses. The risks benefits and pros and cons were discussed. Patient currently using barrier contraception. She was instructed to continue to do her monthly self breast examination. The following labs were ordered today: CBC, cholesterol,  hemoglobin A1c and urinalysis. No Pap smear done today the new screening guidelines discussed.   Ok Edwards MD, 3:21 PM 05/09/2012

## 2012-05-10 LAB — URINALYSIS W MICROSCOPIC + REFLEX CULTURE
Bacteria, UA: NONE SEEN
Casts: NONE SEEN
Hgb urine dipstick: NEGATIVE
Nitrite: NEGATIVE
Protein, ur: NEGATIVE mg/dL
Urobilinogen, UA: 0.2 mg/dL (ref 0.0–1.0)
pH: 7 (ref 5.0–8.0)

## 2012-05-12 ENCOUNTER — Other Ambulatory Visit: Payer: Self-pay | Admitting: Gynecology

## 2012-05-12 DIAGNOSIS — E7800 Pure hypercholesterolemia, unspecified: Secondary | ICD-10-CM

## 2012-05-12 MED ORDER — NITROFURANTOIN MONOHYD MACRO 100 MG PO CAPS: 100.0000 mg | ORAL_CAPSULE | Freq: Two times a day (BID) | ORAL | Status: DC

## 2012-05-13 LAB — URINE CULTURE: Colony Count: 25000

## 2012-05-16 ENCOUNTER — Other Ambulatory Visit: Payer: 59

## 2012-05-16 DIAGNOSIS — E7800 Pure hypercholesterolemia, unspecified: Secondary | ICD-10-CM

## 2012-05-17 LAB — LIPID PANEL
Cholesterol: 203 mg/dL — ABNORMAL HIGH (ref 0–200)
Triglycerides: 92 mg/dL (ref <150)

## 2012-10-09 ENCOUNTER — Telehealth: Payer: Self-pay | Admitting: Gynecology

## 2012-10-09 NOTE — Telephone Encounter (Signed)
10/09/12-Pt was told that her [facility] insurance states the cost of the Mirena IUD & insertion goes towards her $2500 deductible of which none has been met. Her cost would be $1595.00. She will get back to [place] if she wants to proceed or see JF for another form of birth control/wl

## 2012-12-18 ENCOUNTER — Telehealth: Payer: Self-pay | Admitting: Gynecology

## 2012-12-18 ENCOUNTER — Other Ambulatory Visit: Payer: Self-pay | Admitting: Gynecology

## 2012-12-18 DIAGNOSIS — Z3049 Encounter for surveillance of other contraceptives: Secondary | ICD-10-CM

## 2012-12-18 MED ORDER — LEVONORGESTREL 20 MCG/24HR IU IUD: INTRAUTERINE_SYSTEM | Freq: Once | INTRAUTERINE | Status: AC

## 2012-12-18 NOTE — Telephone Encounter (Signed)
12/18/12-I reck pt [employer] ins and they will now cover the Mirena & insertion at 100%,no copay. Pt is scheduled with JF for 12/19/12/WL

## 2012-12-19 ENCOUNTER — Ambulatory Visit (INDEPENDENT_AMBULATORY_CARE_PROVIDER_SITE_OTHER): Payer: 59 | Admitting: Gynecology

## 2012-12-19 ENCOUNTER — Encounter: Payer: Self-pay | Admitting: Gynecology

## 2012-12-19 VITALS — BP 120/76

## 2012-12-19 DIAGNOSIS — Z30430 Encounter for insertion of intrauterine contraceptive device: Secondary | ICD-10-CM

## 2012-12-19 DIAGNOSIS — Z975 Presence of (intrauterine) contraceptive device: Secondary | ICD-10-CM | POA: Insufficient documentation

## 2012-12-19 NOTE — Patient Instructions (Signed)
Informacin sobre el dispositivo intrauterino  (Intrauterine Device Information) El dispositivo intrauterino (DIU) se inserta en el tero e impide el embarazo. Hay dos tipos de DIU:   DIU de cobre. Este tipo de DIU est recubierto con un alambre de cobre y se inserta dentro del tero. El cobre hace que el tero y las trompas de [place] produzcan un liquido que [employer] espermatozoides. El DIU de cobre puede Geneticist, molecular durante 10 aos.  DIU hormonal. Este tipo de DIU contiene la hormona progestina (progesterona sinttica). La hormona espesa el moco cervical y evita que los espermatozoides ingresen al tero y tambin afina la membrana que cubre el tero para evitar la implantacin del vulo fertilizado. La hormona debilita o destruye los espermatozoides que ingresan al tero. El DIU hormonal puede Geneticist, molecular durante 5 aos. El mdico se asegurar de que usted es una buena candidata para usar el DIU cono anticonceptivo. Hable con su mdico acerca de los posibles efectos secundarios.  VENTAJAS  Es muy eficaz, reversible, de accin prolongada y de bajo mantenimiento.  No hay efectos secundarios relacionados con el estrgeno.  El DIU puede ser utilizado durante la Market researcher.  No est asociado con el aumento de [place].  Funciona inmediatamente despus de la insercin.  El DIU de cobre no interfiere con las hormonas femeninas.  El DIU con progesterona puede hacer que los perodos menstruales no sean tan abundantes.  El DIU de progesterona puede usarse durante 5 aos.  El DIU de cobre puede usarse durante 10 aos. DESVENTAJAS  El DIU de progesterona puede estar asociado con patrones de sangrado irregular.  El DIU de cobre puede hacer que el flujo menstrual ms abundante y doloroso.  Puede experimentar clicos y sangrado vaginal despus de la insercin. Document Released: 09/13/2009 Document Revised: 06/18/2011 [facility] Patient Information 2014 [place], [place].

## 2012-12-19 NOTE — Progress Notes (Addendum)
IUD procedure note       Patient presented to the office today for placement of Mirena IUD. The patient had previously been provided with literature information on this method of contraception. The risks benefits and pros and cons were discussed and all her questions were answered. She is fully aware that this form of contraception is 99% effective and is good for 5 years.  Pelvic exam: Bartholin urethra Skene glands: Within normal limits Vagina: No lesions or discharge Cervix: No lesions or discharge Uterus: anteverted position Adnexa: No masses or tenderness Rectal exam: Not done  The cervix was cleansed with Betadine solution. A single-tooth tenaculum was placed on the anterior cervical lip. The uterus sounded to 7 centimeter. The IUD was shown to the patient and inserted in a sterile fashion. The IUD string was trimmed. The single-tooth tenaculum was removed. Patient was instructed to return back to the office in one month for follow up.        Mirena IUD Lot No. [id number]

## 2012-12-22 ENCOUNTER — Encounter: Payer: Self-pay | Admitting: Gynecology

## 2013-01-16 ENCOUNTER — Ambulatory Visit (INDEPENDENT_AMBULATORY_CARE_PROVIDER_SITE_OTHER): Payer: 59 | Admitting: Gynecology

## 2013-01-16 ENCOUNTER — Encounter: Payer: Self-pay | Admitting: Gynecology

## 2013-01-16 VITALS — BP 120/80

## 2013-01-16 DIAGNOSIS — Z30431 Encounter for routine checking of intrauterine contraceptive device: Secondary | ICD-10-CM

## 2013-01-16 NOTE — Progress Notes (Signed)
The patient presented to the office today for one month followup after having placed a Mirena IUD. She's having no complaints.  Exam: Bartholin urethra Skene was within normal limits Vagina: No lesions or discharge Cervix: IUD string was visualized Uterus: Anteverted normal size shape and consistency Adnexa: No palpable mass or tenderness Rectal exam not done  Assessment/plan: Patient status post marina IUD placement one month ago doing well. Patient scheduled to return to the office next year for her annual exam or when necessary.

## 2013-01-16 NOTE — Patient Instructions (Signed)
Influenza Vaccine (Flu Vaccine, Inactivated) 2013 2014 What You Need to Know WHY GET VACCINATED?  Influenza ("flu") is a contagious disease that spreads around the [place] every winter, usually between October and May.  Flu is caused by the influenza virus, and can be spread by coughing, sneezing, and close contact.  Anyone can get flu, but the risk of getting flu is highest among children. Symptoms come on suddenly and may last several days. They can include:  Fever or chills.  Sore throat.  Muscle aches.  Fatigue.  Cough.  Headache.  Runny or stuffy nose. Flu can make some people much sicker than others. These people include young children, people 65 and older, pregnant women, and people with certain health conditions such as heart, lung or kidney disease, or a weakened immune system. Flu vaccine is especially important for these people, and anyone in close contact with them. Flu can also lead to pneumonia, and make existing medical conditions worse. It can cause diarrhea and seizures in children. Each year thousands of people in the [place] die from flu, and many more are hospitalized. Flu vaccine is the best protection we have from flu and its complications. Flu vaccine also helps prevent spreading flu from person to person. INACTIVATED FLU VACCINE There are 2 types of influenza vaccine:  You are getting an inactivated flu vaccine, which does not contain any live influenza virus. It is given by injection with a needle, and often called the "flu shot."  A different live, attenuated (weakened) influenza vaccine is sprayed into the nostrils. This vaccine is described in a separate Vaccine Information Statement. Flu vaccine is recommended every year. Children 6 months through 8 years of age should get 2 doses the first year they get vaccinated. Flu viruses are always changing. Each year's flu vaccine is made to protect from viruses that are most likely to cause disease  that year. While flu vaccine cannot prevent all cases of flu, it is our best defense against the disease. Inactivated flu vaccine protects against 3 or 4 different influenza viruses. It takes about 2 weeks for protection to develop after the vaccination, and protection lasts several months to a year. Some illnesses that are not caused by influenza virus are often mistaken for flu. Flu vaccine will not prevent these illnesses. It can only prevent influenza. A "high-dose" flu vaccine is available for people 65 years of age and older. The person giving you the vaccine can tell you more about it. Some inactivated flu vaccine contains a very small amount of a mercury-based preservative called thimerosal. Studies have shown that thimerosal in vaccines is not harmful, but flu vaccines that do not contain a preservative are available. SOME PEOPLE SHOULD NOT GET THIS VACCINE Tell the person who gives you the vaccine:  If you have any severe (life-threatening) allergies. If you ever had a life-threatening allergic reaction after a dose of flu vaccine, or have a severe allergy to any part of this vaccine, you may be advised not to get a dose. Most, but not all, types of flu vaccine contain a small amount of egg.  If you ever had Guillain Barr Syndrome (a severe paralyzing illness, also called GBS). Some people with a history of GBS should not get this vaccine. This should be discussed with your doctor.  If you are not feeling well. They might suggest waiting until you feel better. But you should come back. RISKS OF A VACCINE REACTION With a vaccine, like any medicine, there   is a chance of side effects. These are usually mild and go away on their own. Serious side effects are also possible, but are very rare. Inactivated flu vaccine does not contain live flu virus, sogetting flu from this vaccine is not possible. Brief fainting spells and related symptoms (such as jerking movements) can happen after any medical  procedure, including vaccination. Sitting or lying down for about 15 minutes after a vaccination can help prevent fainting and injuries caused by falls. Tell your doctor if you feel dizzy or lightheaded, or have vision changes or ringing in the ears. Mild problems following inactivated flu vaccine:  Soreness, redness, or swelling where the shot was given.  Hoarseness; sore, red or itchy eyes; or cough.  Fever.  Aches.  Headache.  Itching.  Fatigue. If these problems occur, they usually begin soon after the shot and last 1 or 2 days. Moderate problems following inactivated flu vaccine:  Young children who get inactivated flu vaccine and pneumococcal vaccine (PCV13) at the same time may be at increased risk for seizures caused by fever. Ask your doctor for more information. Tell your doctor if a child who is getting flu vaccine has ever had a seizure. Severe problems following inactivated flu vaccine:  A severe allergic reaction could occur after any vaccine (estimated less than 1 in a million doses).  There is a small possibility that inactivated flu vaccine could be associated with Guillan Barr Syndrome (GBS), no more than 1 or 2 cases per million people vaccinated. This is much lower than the risk of severe complications from flu, which can be prevented by flu vaccine. The safety of vaccines is always being monitored. For more information, visit: www.cdc.gov/vaccinesafety/ WHAT IF THERE IS A SERIOUS REACTION? What should I look for?  Look for anything that concerns you, such as signs of a severe allergic reaction, very high fever, or behavior changes. Signs of a severe allergic reaction can include hives, swelling of the face and throat, difficulty breathing, a fast heartbeat, dizziness, and weakness. These would start a few minutes to a few hours after the vaccination. What should I do?  If you think it is a severe allergic reaction or other emergency that cannot wait, call 9 1 1  or get the person to the nearest hospital. Otherwise, call your doctor.  Afterward, the reaction should be reported to the Vaccine Adverse Event Reporting System (VAERS). Your doctor might file this report, or you can do it yourself through the VAERS website at www.vaers.hhs.gov, or by calling 1-800-822-7967. VAERS is only for reporting reactions. They do not give medical advice. THE NATIONAL VACCINE INJURY COMPENSATION PROGRAM The National Vaccine Injury Compensation Program (VICP) is a federal program that was created to compensate people who may have been injured by certain vaccines. Persons who believe they may have been injured by a vaccine can learn about the program and about filing a claim by calling 1-800-338-2382 or visiting the VICP website at www.hrsa.gov/vaccinecompensation HOW CAN I LEARN MORE?  Ask your doctor.  Call your local or state health department.  Contact the Centers for Disease Control and Prevention (CDC):  Call 1-800-232-4636 (1-800-CDC-INFO) or  Visit CDC's website at www.cdc.gov/flu CDC Inactivated Influenza Vaccine Interim VIS (11/02/11) Document Released: 01/18/2006 Document Revised: 12/19/2011 Document Reviewed: 11/02/2011 ExitCare Patient Information 2014 ExitCare[employer].  

## 2013-05-12 ENCOUNTER — Other Ambulatory Visit (HOSPITAL_COMMUNITY)
Admission: RE | Admit: 2013-05-12 | Discharge: 2013-05-12 | Disposition: A | Discharge status: Home / Self Care | Payer: 59 | Source: Ambulatory Visit | Attending: Gynecology | Admitting: Gynecology

## 2013-05-12 ENCOUNTER — Ambulatory Visit (INDEPENDENT_AMBULATORY_CARE_PROVIDER_SITE_OTHER): Payer: 59 | Admitting: Gynecology

## 2013-05-12 ENCOUNTER — Encounter: Payer: Self-pay | Admitting: Gynecology

## 2013-05-12 VITALS — BP 116/78 | Ht 61.5 in | Wt 140.0 lb

## 2013-05-12 DIAGNOSIS — Z1151 Encounter for screening for human papillomavirus (HPV): Secondary | ICD-10-CM | POA: Insufficient documentation

## 2013-05-12 DIAGNOSIS — Z01419 Encounter for gynecological examination (general) (routine) without abnormal findings: Secondary | ICD-10-CM

## 2013-05-12 LAB — CBC WITH DIFFERENTIAL/PLATELET
BASOS ABS: 0 10*3/uL (ref 0.0–0.1)
BASOS PCT: 0 % (ref 0–1)
EOS ABS: 0.1 10*3/uL (ref 0.0–0.7)
Eosinophils Relative: 1 % (ref 0–5)
HEMATOCRIT: 41.3 % (ref 36.0–46.0)
HEMOGLOBIN: 13.9 g/dL (ref 12.0–15.0)
Lymphocytes Relative: 23 % (ref 12–46)
Lymphs Abs: 1.8 10*3/uL (ref 0.7–4.0)
MCH: 29.4 pg (ref 26.0–34.0)
MCHC: 33.7 g/dL (ref 30.0–36.0)
MCV: 87.3 fL (ref 78.0–100.0)
MONO ABS: 0.6 10*3/uL (ref 0.1–1.0)
MONOS PCT: 8 % (ref 3–12)
NEUTROS ABS: 5.5 10*3/uL (ref 1.7–7.7)
Neutrophils Relative %: 68 % (ref 43–77)
Platelets: 209 10*3/uL (ref 150–400)
RBC: 4.73 MIL/uL (ref 3.87–5.11)
RDW: 13.4 % (ref 11.5–15.5)
WBC: 8.1 10*3/uL (ref 4.0–10.5)

## 2013-05-12 LAB — COMPREHENSIVE METABOLIC PANEL
ALT: 12 U/L (ref 0–35)
AST: 13 U/L (ref 0–37)
Albumin: 4.6 g/dL (ref 3.5–5.2)
Alkaline Phosphatase: 50 U/L (ref 39–117)
BILIRUBIN TOTAL: 0.4 mg/dL (ref 0.2–1.2)
BUN: 14 mg/dL (ref 6–23)
CALCIUM: 9.4 mg/dL (ref 8.4–10.5)
CHLORIDE: 104 meq/L (ref 96–112)
CO2: 28 meq/L (ref 19–32)
CREATININE: 0.72 mg/dL (ref 0.50–1.10)
GLUCOSE: 93 mg/dL (ref 70–99)
Potassium: 3.8 mEq/L (ref 3.5–5.3)
Sodium: 137 mEq/L (ref 135–145)
Total Protein: 6.9 g/dL (ref 6.0–8.3)

## 2013-05-12 LAB — CHOLESTEROL, TOTAL: Cholesterol: 194 mg/dL (ref 0–200)

## 2013-05-12 NOTE — Patient Instructions (Signed)
Virus del papiloma humano (VPH) - Madilyn Fireman Gardasil - Lo que debe saber  (Human Papillomavirus (HPV) Gardasil Vaccine, What You Need to Know) QU ES EL VPH?   El virus Virus del papiloma humano - (VPH) es el virus que se transmite sexualmente con ms frecuencia en los [place]. Ms de la mitad de los hombres y mujeres sexualmente activos se infectan con el VPH en algn momento de sus vidas.  Alrededor de 20 millones de estadounidenses estn actualmente infectados y cerca de 6 millones ms se infectan cada ao. El VPH se transmite a travs del contacto sexual.  Katha Hamming de las infecciones por VPH no causan ningn sntoma y Electronics engineer sin TEFL teacher. Pero el VPH puede causar cncer de cuello uterino  en las mujeres. El cncer de cuello de tero es la segunda causa de muerte por cncer entre las mujeres de todo el [place]. En los [address], cerca de 12,000 mujeres contraen cncer de cuello de tero cada ao y alrededor de 4000 mueren a causa de ella.  El VPH se asocia con algunos tipos de cncer poco frecuente, como el cncer vaginal y vulvar en la mujer y anal y Rena Lara (parte posterior de la garganta, incluyendo la base de la lengua y las Chicken) en hombres y [place]. Tambin puede causar verrugas genitales y en la garganta.  No hay cura para la infeccin por VPH, pero algunos de los problemas que causa pueden tratarse. VACUNA CONTRA EL VPH PORQUE VACUNARSE?   La vacuna contra el VPH que est recibiendo es 1 de 2 vacunas que se pueden dar para Pensions consultant. Se puede administrar a hombres y mujeres.  Esta vacuna puede prevenir la [employer] de los casos de cncer de cuello de tero en las mujeres, si se administra antes de la exposicin al virus. Adems, se puede prevenir el cncer vaginal y vulvar en las mujeres, y las verrugas genitales y el cncer anal tanto en hombres como en mujeres.  Se espera que la proteccin de la vacuna Administrator, sports VPH sea [place]. Sin embargo, la  vacunacin no es un sustituto de los estudios para Architectural technologist cncer de cuello uterino. Las mujeres deben hacerse pruebas de Papanicolaou con regularidad. QUINES DEBEN RECIBIR LA VACUNA CONTRA EL VPH Y CUNDO?  La vacuna contra el VPH se administra en una serie de 3 dosis.   Primera dosis: Ahora.  Segunda dosis: 1 a 2 meses despus de la primera dosis.  Tercera dosis: 6 meses despus de la 1a. dosis No se recomiendan dosis adicionales (refuerzos).  Vacunacin de rutina  La vacuna contra el VPH se recomienda para nias y nios de 11  12 aos de edad. Se puede  administrar a partir de los [address]  Por qu se recomienda la Animal nutritionist VPH a los 11 o 12 aos de edad?  La infeccin por el VPH se contrae fcilmente, an cuando se tenga slo un compaero sexual. Por eso es importante tener la vacuna contra el VPH antes de todo contacto sexual Adems, la respuesta a la vacuna es mejor a esta edad que cuando se es Forensic scientist.  Actualizacin de la vacuna Esta vacuna se recomienda para quienes no hayan completado la serie de 3 dosis:   Nias entre 13 y 21 aos de edad.  Varones entre 13 y 21 aos de [address]. Esta vacuna puede aplicarse a varones entre los 22 y los 26 aos que no hayan completado la serie de 3 dosis.  Se recomienda en  varones de 26 aos que tengan relaciones sexuales con varones o a aquellos cuyo sistema inmunolgico est debilitado debido a una infeccin por VIH, o que sufran otras enfermedades o que tomen medicamentos.  Estas vacunas pueden administrarse de manera segura simultneamente con otras vacunas.  ALGUNAS PERSONAS NO DEBEN RECIBIR VACUNA CONTRA EL VPH O DEBEN ESPERAR   Toda persona que haya sufrido una reaccin alrgica que haya puesto en peligro su vida por cualquier componente de la vacuna conta el VPH, o a una dosis previa de esta vacuna, no debe recibirla. Informe a su mdico si la persona que va a recibir la vacuna ha sufrido Environmental consultant graves, incluyendo alergia a las  levaduras.  La vacuna contra el VPH no se recomienda en mujeres embarazadas. Sin embargo, si ha recibido Research officer, trade union, no es un motivo para considerar su interrupcin. Las mujeres que amamantan pueden recibir la vacuna.  Las Geologist, engineering en el momento que se ha programado la [place], pueden recibirla. Las personas con una enfermedad moderada o grave, deben Administrator. CULES SON LOS RIESGOS DE ESTA VACUNA?   La vacuna contra el VPH se ha [employer] Unidos y en todo el mundo durante 6 aos y ha demostrado ser segura.  Sin embargo, cualquier medicamento puede causar problemas como una reaccin alrgica grave. El riesgo de que una vacuna cause daos graves, o la [place], es extremadamente pequeo.  Las Therapist, art a causa de la vacuna que ponen en peligro la vida ocurren en raras ocasiones. Si aparecen, generalmente es despus de algunos minutos o algunas horas luego de la aplicacin de la vacuna. Algunos sntomas leves a moderados pueden aparecer despus de la vacuna contra el VPH. Estos sntomas no duran mucho y desaparecen sin tratamiento.   Reacciones en el brazo, en el sitio de la inyeccin:  Dolor (en 8 de cada 10 personas).  Enrojecimiento o hinchazn (alrededor de 1 de cada 4 personas).  Fiebre:  Leve (100 F o 37.8 C) (alrededor de 1 de cada 10 personas).  Moderado (102 F o 38.9 C) (alrededor de 1 de cada 18 personas).  Otros sntomas:  Dolor de Training and development officer (alrededor de 1 cada 3 personas).  Desmayos: Episodios de desmayo leves y sntomas relacionados (tales como sacudidas) pueden presentarse despus de cualquier procedimiento mdico, incluyendo la vacunacin. Si permanece sentado o recostado durante 15 minutos despus de la vacunacin puede ayudar a [employer] y las lesiones causadas por las cadas. Informe al mdico si el paciente se siente mareado o aturdido, tiene [employer] visin o zumbidos en los  odos.  Como todas las [place], seguir siendo controlada para saber si produce efectos inusuales o graves. QU PASA SI HAY UNA REACCIN GRAVE?  Qu signos debo buscar?  Cualquier estado poco habitual, como fiebre alta o cambios en el comportamiento. Entre los signos de Automotive engineer grave se encuentran la dificultad para respirar, ronquera o sibilancias, urticaria, palidez, debilidad, latidos cardacos acelerados o mareos. Qu debo hacer?  Llamar al mdico o llevar inmediatamente a la persona al mdico.  Informe  a su mdico qu ocurri, la fecha y hora en que sucedi y cundo le aplicaron la vacuna.  Pdale  a su mdico, enfermero o al departamento de [address], que informe sobre la reaccin llenando un formulario del Sistema de Informacin de Reacciones Adversas a las Administrator, arts (VAERS, por sus siglas en ingls). O puede hacer el informe usted mismo a travs del sitio wev del VAERS  en www.vaers.LAgents.nohhs.gov o llamando al [phone]. VAERS no ofrece consejos mdicos. PROGRAMA NACIONAL DE COMPENSACIN DE DAOS POR VACUNAS   El Producer, television/film/videoational Vaccine Injury Compensation Program (VICP) es un programa federal que fue creado para compensar a las personas que puedan haber sufrido daos al recibir ciertas vacunas.  Aquellas personas que consideren que han sufrido un dao como consecuencia de una vacuna y quieren saber ms acerca del programa y como presentar Roslynn Ambleuna denuncia, [place]en llamar al [phone] o visitar el sitio web del VICP en SpiritualWord.atwww.hrsa.gov/vaccinecompensation CMO Roxan DieselPUEDO OBTENER MS INFORMACIN?   Consulte a su mdico.  Comunquese con el servicio de salud de su localidad o [address].  Comunquese con los Centros para el control y la prevencin de Child psychotherapistenfermedades (Centers for Disease Control and Prevention , CDC).  Llamando al [phone])[phone] (1-800-CDC-INFO)  o  Visitando el sitio web del CDC en PicCapture.uywww.cdc.gov/vaccines CDC Human Papillomavirus (HPV) Gardasil (Interim) 08/24/11  Document  Released: 08/12/2008 Document Revised: 12/19/2011 ExitCare Patient Information 2014 [place].

## 2013-05-12 NOTE — Progress Notes (Signed)
Cheryl [name] Aug 29, 1975 [id number]   History:    38 y.o.  for annual gyn exam with no complaints today. Patient has been very happy with the [name] IUD it was placed in 2014. She is having very few if any light menstrual cycles. Her flu vaccine is up to date. Patient with no prior history of abnormal Pap smears. She lost one pound since last year. Patient frequently does her monthly breast exam.  Past medical history,surgical history, family history and social history were all reviewed and documented in the EPIC chart.  Gynecologic History No LMP recorded. Patient is not currently having periods (Reason: IUD). Contraception: IUD Last Pap: 2012. Results were: normal Last mammogram: Not indicated. Results were: Not indicated  Obstetric History OB History  Gravida Para Term Preterm AB SAB TAB Ectopic Multiple Living  3 3 3       3     # Outcome Date GA Lbr Len/2nd Weight Sex Delivery Anes PTL Lv  3 TRM     M SVD   Y  2 TRM     M SVD   Y  1 TRM     F SVD   Y       ROS: A ROS was performed and pertinent positives and negatives are included in the history.  GENERAL: No fevers or chills. HEENT: No change in vision, no earache, sore throat or sinus congestion. NECK: No pain or stiffness. CARDIOVASCULAR: No chest pain or pressure. No palpitations. PULMONARY: No shortness of breath, cough or wheeze. GASTROINTESTINAL: No abdominal pain, nausea, vomiting or diarrhea, melena or bright red blood per rectum. GENITOURINARY: No urinary frequency, urgency, hesitancy or dysuria. MUSCULOSKELETAL: No joint or muscle pain, no back pain, no recent trauma. DERMATOLOGIC: No rash, no itching, no lesions. ENDOCRINE: No polyuria, polydipsia, no heat or cold intolerance. No recent change in weight. HEMATOLOGICAL: No anemia or easy bruising or bleeding. NEUROLOGIC: No headache, seizures, numbness, tingling or weakness. PSYCHIATRIC: No depression, no loss of interest in normal activity or change in sleep pattern.     Exam: chaperone present  BP 116/78  Ht 5' 1.5" (1.562 m)  Wt 140 lb (63.504 kg)  BMI 26.03 kg/m2  Body mass index is 26.03 kg/(m^2).  General appearance : Well developed well nourished female. No acute distress HEENT: Neck supple, trachea midline, no carotid bruits, no thyroidmegaly Lungs: Clear to auscultation, no rhonchi or wheezes, or rib retractions  Heart: Regular rate and rhythm, no murmurs or gallops Breast:Examined in sitting and supine position were symmetrical in appearance, no palpable masses or tenderness,  no skin retraction, no nipple inversion, no nipple discharge, no skin discoloration, no axillary or supraclavicular lymphadenopathy Abdomen: no palpable masses or tenderness, no rebound or guarding Extremities: no edema or skin discoloration or tenderness  Pelvic:  Bartholin, Urethra, Skene Glands: Within normal limits             Vagina: No gross lesions or discharge  Cervix: No gross lesions or discharge, IUD string seen  Uterus  anteverted, normal size, shape and consistency, non-tender and mobile  Adnexa  Without masses or tenderness  Anus and perineum  normal   Rectovaginal  normal sphincter tone without palpated masses or tenderness             Hemoccult not indicated     Assessment/Plan:  38 y.o. female for annual exam who had her Pap smear done today. The following labs were ordered: CBC, screen cholesterol, comprehensive metabolic panel, TSH, as  well as urinalysis. Patient otherwise scheduled to return back in 1 year or when necessary.  Note: This dictation was prepared with  Dragon/digital dictation along withSmart phrase technology. Any transcriptional errors that result from this process are unintentional.   Ok EdwardsFERNANDEZ,JUAN H MD, 4:41 PM 05/12/2013

## 2013-05-13 LAB — URINALYSIS W MICROSCOPIC + REFLEX CULTURE
Bacteria, UA: NONE SEEN
Bilirubin Urine: NEGATIVE
CASTS: NONE SEEN
CRYSTALS: NONE SEEN
GLUCOSE, UA: NEGATIVE mg/dL
HGB URINE DIPSTICK: NEGATIVE
Ketones, ur: NEGATIVE mg/dL
LEUKOCYTES UA: NEGATIVE
NITRITE: NEGATIVE
PH: 6.5 (ref 5.0–8.0)
Protein, ur: NEGATIVE mg/dL
SPECIFIC GRAVITY, URINE: 1.013 (ref 1.005–1.030)
SQUAMOUS EPITHELIAL / LPF: NONE SEEN
Urobilinogen, UA: 0.2 mg/dL (ref 0.0–1.0)

## 2013-05-13 LAB — TSH: TSH: 0.851 u[IU]/mL (ref 0.350–4.500)

## 2014-02-08 ENCOUNTER — Encounter: Payer: Self-pay | Admitting: Gynecology

## 2015-06-07 ENCOUNTER — Encounter: Payer: Self-pay | Admitting: Gynecology

## 2015-06-08 ENCOUNTER — Encounter: Payer: Self-pay | Admitting: Gynecology

## 2015-06-14 ENCOUNTER — Encounter: Payer: Self-pay | Admitting: Gynecology

## 2015-07-28 ENCOUNTER — Encounter: Payer: Self-pay | Admitting: Gynecology

## 2015-07-28 ENCOUNTER — Ambulatory Visit (INDEPENDENT_AMBULATORY_CARE_PROVIDER_SITE_OTHER): Payer: 59 | Admitting: Gynecology

## 2015-07-28 VITALS — BP 122/78 | Ht 61.5 in | Wt 139.0 lb

## 2015-07-28 DIAGNOSIS — N644 Mastodynia: Secondary | ICD-10-CM

## 2015-07-28 DIAGNOSIS — Z01419 Encounter for gynecological examination (general) (routine) without abnormal findings: Secondary | ICD-10-CM

## 2015-07-28 LAB — CBC WITH DIFFERENTIAL/PLATELET
BASOS ABS: 0 {cells}/uL (ref 0–200)
Basophils Relative: 0 %
EOS ABS: 156 {cells}/uL (ref 15–500)
Eosinophils Relative: 2 %
HEMATOCRIT: 40.4 % (ref 35.0–45.0)
Hemoglobin: 13.5 g/dL (ref 11.7–15.5)
LYMPHS PCT: 22 %
Lymphs Abs: 1716 cells/uL (ref 850–3900)
MCH: 29.6 pg (ref 27.0–33.0)
MCHC: 33.4 g/dL (ref 32.0–36.0)
MCV: 88.6 fL (ref 80.0–100.0)
MONO ABS: 546 {cells}/uL (ref 200–950)
MONOS PCT: 7 %
MPV: 11.4 fL (ref 7.5–12.5)
NEUTROS PCT: 69 %
Neutro Abs: 5382 cells/uL (ref 1500–7800)
PLATELETS: 215 10*3/uL (ref 140–400)
RBC: 4.56 MIL/uL (ref 3.80–5.10)
RDW: 13.2 % (ref 11.0–15.0)
WBC: 7.8 10*3/uL (ref 3.8–10.8)

## 2015-07-28 LAB — TSH: TSH: 0.8 mIU/L

## 2015-07-28 NOTE — Progress Notes (Signed)
Cheryl [name] Aug 17, 1975 [id number]   History:    40 y.o.  for annual gyn exam  Which was reported to be normal but dense. She had a Mirena IUD placed in 2014 and has done well and is having very light if any menstrual cycles. She has always had a normal Pap smear in the past.  Past medical history,surgical history, family history and social history were all reviewed and documented in the EPIC chart.  Gynecologic History No LMP recorded. Patient is not currently having periods (Reason: IUD). Contraception: IUD Last Pap:  2015. Results were: normal Last mammogram:  2017. Results were:  Normal but dense  Obstetric History OB History  Gravida Para Term Preterm AB SAB TAB Ectopic Multiple Living  # Outcome Date GA Lbr Len/2nd Weight Sex Delivery Anes PTL Lv  3 Term     M Vag-Spont   Y  2 Term     M Vag-Spont   Y  1 Term     F Vag-Spont   Y       ROS: A ROS was performed and pertinent positives and negatives are included in the history.  GENERAL: No fevers or chills. HEENT: No change in vision, no earache, sore throat or sinus congestion. NECK: No pain or stiffness. CARDIOVASCULAR: No chest pain or pressure. No palpitations. PULMONARY: No shortness of breath, cough or wheeze. GASTROINTESTINAL: No abdominal pain, nausea, vomiting or diarrhea, melena or bright red blood per rectum. GENITOURINARY: No urinary frequency, urgency, hesitancy or dysuria. MUSCULOSKELETAL: No joint or muscle pain, no back pain, no recent trauma. DERMATOLOGIC: No rash, no itching, no lesions. ENDOCRINE: No polyuria, polydipsia, no heat or cold intolerance. No recent change in weight. HEMATOLOGICAL: No anemia or easy bruising or bleeding. NEUROLOGIC: No headache, seizures, numbness, tingling or weakness. PSYCHIATRIC: No depression, no loss of interest in normal activity or change in sleep pattern.     Exam: chaperone present  BP 122/78 mmHg  Ht 5' 1.5" (1.562 m)  Wt 139 lb (63.05 kg)  BMI  25.84 kg/m2  Body mass index is 25.84 kg/(m^2).  General appearance : Well developed well nourished female. No acute distress HEENT: Eyes: no retinal hemorrhage or exudates,  Neck supple, trachea midline, no carotid bruits, no thyroidmegaly Lungs: Clear to auscultation, no rhonchi or wheezes, or rib retractions  Heart: Regular rate and rhythm, no murmurs or gallops Breast:Examined in sitting and supine position were symmetrical in appearance, no palpable masses or tenderness,  no skin retraction, no nipple inversion, no nipple discharge, no skin discoloration, no axillary or supraclavicular lymphadenopathy Abdomen: no palpable masses or tenderness, no rebound or guarding Extremities: no edema or skin discoloration or tenderness  Pelvic:  Bartholin, Urethra, Skene Glands: Within normal limits             Vagina: No gross lesions or discharge  Cervix: No gross lesions or discharge, IUD string visualized  Uterus   anteverted, normal size, shape and consistency, non-tender and mobile  Adnexa  Without masses or tenderness  Anus and perineum  normal   Rectovaginal  normal sphincter tone without palpated masses or tenderness             Hemoccult  Not indicated     Assessment/Plan:  40 y.o. female for annual exam  With occasional mastodynia and it appears that she consumes large quantities of coffee during the day and she was instructed to decrease  the quantity. She was instructed to continue to do her monthly breast exam. She is not fasting today so only a screening cholesterol along with her comprehensive metabolic panel, TSH, CBC, and urinalysis will be obtained today. Pap smear not indicated this year according to the new guidelines.   Ok EdwardsFERNANDEZ,JUAN H MD, 4:23 PM 07/28/2015

## 2015-07-28 NOTE — Patient Instructions (Signed)
Sensibilidad en las mamas (Breast Tenderness) La sensibilidad en las mamas es un problema frecuente en las mujeres de todas las edades. y puede causar molestias leves o dolor intenso. Sus causas son variadas. Su mdico determinar la causa probable de la sensibilidad mediante el examen de las mamas, las preguntas sobre los sntomas y la indicacin de algunos estudios. Por lo general, la sensibilidad en las mamas no significa que tenga cncer de mama. INSTRUCCIONES PARA EL CUIDADO EN EL HOGAR  A menudo, la sensibilidad en las mamas puede tratarse en el hogar. Puede intentar lo siguiente:  Probarse un nuevo sostn que le brinde ms sujecin, especialmente mientras hace actividad fsica.  Usar un sostn con mejor sujecin o uno deportivo mientras duerme cuando las mamas estn muy sensibles.  Si tiene una lesin mamaria, aplique hielo en la zona:  Ponga el hielo en una bolsa plstica.  Colquese una toalla entre la piel y la bolsa de hielo.  Deje el hielo durante 20 minutos y aplquelo 2 a 3 veces por da.  Si tiene las mamas repletas de leche debido a la lactancia, intente lo siguiente:  Extrigase leche manualmente o con un sacaleche.  Aplquese una compresa tibia en las mamas para ayudar a la descarga.  Tome analgsicos de venta libre si su mdico lo autoriza.  Tome otros medicamentos que su mdico le recete, entre ellos, antibiticos o anticonceptivos. A largo plazo, puede aliviar la sensibilidad en las mamas si hace lo siguiente:  Disminuye el consumo de cafena.  Disminuye la cantidad de grasa de la dieta. Lleva un registro de los das y las horas cuando tiene mayor sensibilidad en las mamas. Esto ser de ayuda para que usted y su mdico encuentren la causa de la sensibilidad y cmo aliviarla. Adems, aprenda cmo examinarse las mamas en casa. Esto la ayudar a palpar un crecimiento o un bulto fuera de lo normal que podra causar la sensibilidad. SOLICITE ATENCIN MDICA SI:    Cualquier zona de la mama est dura, enrojecida y caliente al tacto. Puede ser un signo de infeccin.  Hay secrecin de los pezones (y no est amamantando). En especial, vigile la secrecin de sangre o pus.  Tiene fiebre, adems de sensibilidad en las mamas.  Tiene un bulto nuevo o doloroso en la mama que no desaparece despus de la finalizacin del perodo menstrual.  Ha intentando controlar el dolor en casa, pero no desaparece.  El dolor de la mama es ms intenso o le dificulta hacer las cosas que hace habitualmente durante el da.   Esta informacin no tiene como fin reemplazar el consejo del mdico. Asegrese de hacerle al mdico cualquier pregunta que tenga.   Document Released: 01/14/2013 Elsevier Interactive Patient Education 2016 Elsevier Inc.  

## 2015-07-29 LAB — COMPREHENSIVE METABOLIC PANEL
ALBUMIN: 4.4 g/dL (ref 3.6–5.1)
ALT: 13 U/L (ref 6–29)
AST: 16 U/L (ref 10–30)
Alkaline Phosphatase: 46 U/L (ref 33–115)
BILIRUBIN TOTAL: 0.3 mg/dL (ref 0.2–1.2)
BUN: 10 mg/dL (ref 7–25)
CALCIUM: 9.2 mg/dL (ref 8.6–10.2)
CHLORIDE: 103 mmol/L (ref 98–110)
CO2: 25 mmol/L (ref 20–31)
Creat: 0.61 mg/dL (ref 0.50–1.10)
GLUCOSE: 87 mg/dL (ref 65–99)
POTASSIUM: 3.8 mmol/L (ref 3.5–5.3)
Sodium: 139 mmol/L (ref 135–146)
Total Protein: 6.6 g/dL (ref 6.1–8.1)

## 2015-07-29 LAB — URINALYSIS W MICROSCOPIC + REFLEX CULTURE
BACTERIA UA: NONE SEEN [HPF]
BILIRUBIN URINE: NEGATIVE
Casts: NONE SEEN [LPF]
Crystals: NONE SEEN [HPF]
Glucose, UA: NEGATIVE
Hgb urine dipstick: NEGATIVE
KETONES UR: NEGATIVE
Leukocytes, UA: NEGATIVE
Nitrite: NEGATIVE
PROTEIN: NEGATIVE
Specific Gravity, Urine: 1.017 (ref 1.001–1.035)
Yeast: NONE SEEN [HPF]
pH: 6 (ref 5.0–8.0)

## 2015-07-29 LAB — CHOLESTEROL, TOTAL: Cholesterol: 178 mg/dL (ref 125–200)

## 2015-07-30 LAB — URINE CULTURE

## 2015-09-08 ENCOUNTER — Encounter: Payer: Self-pay | Admitting: Gynecology

## 2015-09-08 ENCOUNTER — Ambulatory Visit (INDEPENDENT_AMBULATORY_CARE_PROVIDER_SITE_OTHER): Payer: 59 | Admitting: Gynecology

## 2015-09-08 VITALS — BP 116/78

## 2015-09-08 DIAGNOSIS — L292 Pruritus vulvae: Secondary | ICD-10-CM

## 2015-09-08 DIAGNOSIS — R350 Frequency of micturition: Secondary | ICD-10-CM

## 2015-09-08 DIAGNOSIS — R102 Pelvic and perineal pain: Secondary | ICD-10-CM | POA: Diagnosis not present

## 2015-09-08 DIAGNOSIS — IMO0001 Reserved for inherently not codable concepts without codable children: Secondary | ICD-10-CM

## 2015-09-08 DIAGNOSIS — N390 Urinary tract infection, site not specified: Secondary | ICD-10-CM

## 2015-09-08 LAB — URINALYSIS W MICROSCOPIC + REFLEX CULTURE
Bilirubin Urine: NEGATIVE
CRYSTALS: NONE SEEN [HPF]
Casts: NONE SEEN [LPF]
GLUCOSE, UA: NEGATIVE
HGB URINE DIPSTICK: NEGATIVE
Ketones, ur: NEGATIVE
Nitrite: NEGATIVE
PH: 7 (ref 5.0–8.0)
PROTEIN: NEGATIVE
RBC / HPF: NONE SEEN RBC/HPF (ref <=2)
Specific Gravity, Urine: 1.015 (ref 1.001–1.035)
YEAST: NONE SEEN [HPF]

## 2015-09-08 LAB — WET PREP FOR TRICH, YEAST, CLUE
CLUE CELLS WET PREP: NONE SEEN
TRICH WET PREP: NONE SEEN

## 2015-09-08 MED ORDER — FLUCONAZOLE 150 MG PO TABS: 150.0000 mg | ORAL_TABLET | Freq: Once | ORAL | Status: DC

## 2015-09-08 MED ORDER — CIPROFLOXACIN HCL 250 MG PO TABS: 250.0000 mg | ORAL_TABLET | Freq: Two times a day (BID) | ORAL | Status: DC

## 2015-09-08 MED ORDER — PHENAZOPYRIDINE HCL 100 MG PO TABS: 100.0000 mg | ORAL_TABLET | Freq: Three times a day (TID) | ORAL | Status: DC | PRN

## 2015-09-08 NOTE — Patient Instructions (Signed)
Phenazopyridine tablets Qu es este medicamento? La FENAZOPIRIDINA es un analgsico. Genene Churn para Best boy, ardor o molestia causada por una infeccin o irritacin del tracto urinario. Este medicamento no es un antibitico. No curar una infeccin del tracto urinario. Este medicamento puede ser utilizado para otros usos; si tiene alguna pregunta consulte con su proveedor de atencin mdica o con su farmacutico. Qu le debo informar a mi profesional de la salud antes de tomar este medicamento? Necesita saber si usted presenta alguno de los siguientes problemas o situaciones: -deficiencia de glucosa-6-fosfato deshidrogenasa (L-6PD) -enfermedad renal -una reaccin alrgica o inusual a la fenazopiridina, a otros medicamentos, alimentos, colorantes o conservantes -si est embarazada o buscando quedar embarazada -si est amamantando a un beb Cmo debo utilizar este medicamento? Tome este medicamento por va oral con un vaso de agua. Siga las instrucciones de la etiqueta del [place]. Tmelo despus de las comidas. Tome sus dosis a intervalos regulares. No tome su medicamento con una frecuencia mayor que la indicada. No omita ninguna dosis o suspenda el uso de su medicamento antes de lo indicado aun si se siente mejor. No deje de tomarlo excepto si as lo indica su mdico. Hable con su pediatra para informarse acerca del uso de este medicamento en nios. Puede requerir atencin especial. Sobredosis: Pngase en contacto inmediatamente con un centro toxicolgico o una sala de urgencia si usted cree que haya tomado demasiado medicamento. ATENCIN: [employer] es solo para usted. No comparta este medicamento con nadie. Qu sucede si me olvido de una dosis? Si olvida una dosis, tmela lo antes posible. Si es casi la hora de la prxima dosis, tome slo esa dosis. No tome dosis adicionales o dobles. Qu puede interactuar con este medicamento? No se esperan interacciones. Puede ser que  esta lista no menciona todas las posibles interacciones. Informe a su profesional de KB Home	[place] de [employer] productos a base de hierbas, medicamentos de Cutter o suplementos nutritivos que est tomando. Si usted fuma, consume bebidas alcohlicas o si utiliza drogas ilegales, indqueselo tambin a su profesional de KB Home	[place]. Algunas sustancias pueden interactuar con su medicamento. A qu debo estar atento al usar [employer]? Si los sntomas no mejoran o si empeoran, consulte con su mdico o con su profesional de KB Home	[place]. Este medicamento produce un color rojo en los lquidos corporales. Este efecto es inofensivo y Armed forces operational officer despus de que deje de tomar este medicamento. Este medicamento cambiar el color de su orina a un color naranja oscura o rojo. El color rojo puede manchar la ropa. Los lentes de contacto se pueden Marketing executive. Es preferible no usar lentes de contacto blandos mientras est tomando este medicamento Si es diabtico podr Therapist, music un resultado positivo falso en los anlisis de determinacin del nivel de Location manager en la orina. Consulte a su proveedor de Geophysical data processor. Qu efectos secundarios puedo tener al [employer] este medicamento? Efectos secundarios que debe informar a su mdico o a Barrister's clerk de la salud tan pronto como sea posible: -Chief of Staff como erupcin cutnea, picazn o urticarias, hinchazn de la cara, labios o lengua -color azul o morado de la piel -dificultad al respirar -fiebre -orinar menos -sangrado, magulladuras inusuales -cansancio o debilidad inusual -vmitos -color amarillento de los ojos o la piel Efectos secundarios que, por lo general, no requieren Geophysical data processor (debe informarlos a su mdico o a su profesional de la salud si persisten o si son molestos): -orina de color amarillo oscuro -dolor de cabeza -Engineer, production  ser que esta lista no menciona todos los posibles efectos secundarios. Comunquese a su  mdico por asesoramiento mdico Hewlett-Packardsobre los efectos secundarios. Usted puede informar los efectos secundarios a la FDA por telfono al 1-800-FDA-1088. Dnde debo guardar mi medicina? Mantngala fuera del alcance de los nios. Gurdela a Sanmina-SCItemperatura ambiente, entre 15 y 30 grados C (6159 y 286 grados F). Protjala de la luz y de la humedad. Deseche todo el medicamento que no haya utilizado, despus de la fecha de vencimiento. ATENCIN: Este folleto es un resumen. Puede ser que no cubra toda la posible informacin. Si usted tiene preguntas acerca de esta medicina, consulte con su mdico, su farmacutico o su profesional de Radiographer, therapeuticla salud.    2016, Elsevier/Gold Standard. (2014-05-18 00:00:00) Ciprofloxacin tablets Qu es este medicamento? La CIPROFLOXACINA es un antibitico quinolnico. Se utiliza en el tratamiento de ciertos tipos de infecciones bacterianas. No es efectivo para resfros, gripe u otras infecciones de origen viral. Este medicamento puede ser utilizado para otros usos; si tiene alguna pregunta consulte con su proveedor de atencin mdica o con su farmacutico. Qu le debo informar a mi profesional de la salud antes de tomar este medicamento? Necesita saber si usted presenta alguno de los siguientes problemas o situaciones: -problemas de huesos -enfermedad cerebral -problemas de articulaciones -pulso cardaco irregular -enfermedad renal -enfermedad heptica -miastenia gravis -trastorno de convulsiones -problemas de tendones -una reaccin alrgica o inusual a la ciprofloxacina, a otros antibiticos, medicamentos, alimentos, colorantes o conservantes -si est embarazada o buscando quedar embarazada -si est amamantando a un beb Cmo debo utilizar este medicamento? Tome este medicamento por va oral con un vaso de agua. Siga las instrucciones de la etiqueta del Puhimedicamento. Tome sus dosis a intervalos regulares. No tome su medicamento con una frecuencia mayor a la indicada. Tome todas  las dosis de su medicamento como se le haya indicado aun si se siente mejor. No omita ninguna dosis o suspenda el uso de su medicamento antes de lo indicado. Este Automatic Datamedicamento se puede tomar con las comidas o con el estmago vaco. No lo debe tomar con productos lcteos, tales como [place], yogur o jugos fortificados con Auto-Owners Insurancecalcio solos, sin embargo lo puede tomar con comidas que contienen productos lcteos. Su farmacutico le dar una [place] del medicamento especial con cada receta y relleno. Asegrese de leer esta informacin cada vez cuidadosamente. Hable con su pediatra para informarse acerca del uso de este medicamento en nios. Puede requerir atencin especial. Sobredosis: Pngase en contacto inmediatamente con un centro toxicolgico o una sala de urgencia si usted cree que haya tomado demasiado medicamento. ATENCIN: Reynolds AmericanEste medicamento es solo para usted. No comparta este medicamento con nadie. Qu sucede si me olvido de una dosis? Si olvida una dosis, tmela lo antes posible. Si es casi la hora de la prxima dosis, tome slo esa dosis. No tome dosis adicionales o dobles. Qu puede interactuar con este medicamento? No tome esta medicina con ninguno de los siguientes medicamentos: -cisapride -droperidol -terfenadina -tizanidina Esta medicina tambin puede interactuar con los siguientes medicamentos -anticidos -pldoras anticonceptivas -cafena -ciclosporina -polvo o tabletas tamponadas de didanosina (ddI) -medicamentos para la diabetes -medicamentos para la inflamacin, tales como ibuprofeno, naproxeno -metotrexato -multivitaminas -omeprazol -fenitona -probenecid -sucralfato -teofilina -warfarina Puede ser que esta lista no menciona todas las posibles interacciones. Informe a su profesional de Beazer Homesla salud de [employer] Inctodos los productos a base de hierbas, medicamentos de Petroliaventa libre o suplementos nutritivos que est tomando. Si usted fuma, consume bebidas alcohlicas o si utiliza drogas ilegales,  indqueselo  tambin a su profesional de [employer]. Algunas sustancias pueden interactuar con su medicamento. A qu debo estar atento al usar [employer] Corporation? Si los sntomas no mejoran, consulte con su mdico o con su profesional de [employer]. No trate la diarrea con productos de [employer]. Comunquese con su mdico si tiene diarrea que dura ms de 2 das o si es severa y [place]. Puede experimentar somnolencia o mareos. No conduzca ni utilice maquinaria, ni haga nada que Scientist, research (life sciences) en estado de alerta hasta que sepa cmo le afecta este medicamento. No se siente ni se ponga de pie con rapidez, especialmente si es un paciente de edad avanzada. Esto reduce el riesgo de mareos o [employer]. Este medicamento puede aumentar la sensibilidad al sol. Mantngase fuera de Secretary/administrator. Si no lo puede evitar, utilice ropa protectora y crema de Orthoptist. No utilice lmparas solares, camas solares ni cabinas solares. Evite las preparaciones de anticidos, aluminio, calcio, hierro, magnesio o cinc por lo menos 6 horas antes o 2 horas despus de Financial risk analyst. Qu efectos secundarios puedo tener al [employer] este medicamento? Efectos secundarios que debe informar a su mdico o a Producer, television/film/video de la salud tan pronto como sea posible: -Therapist, art como erupcin cutnea, picazn o urticarias, hinchazn de la cara, labios o lengua -problemas respiratorios -confusin, pesadillas o alucinaciones -sensacin de desmayos o aturdimiento, cadas -pulso cardiaco irregular -hinchazn o dolores musculares, articulares o de tendones -dolor o dificultad para orinar -dolor de cabeza persistente con o sin visin borrosa -enrojecimiento, formacin de ampollas, descamacin o aflojamiento de la piel, inclusive dentro de la boca -convulsiones -dolor, entumecimiento, hormigueo o debilidad inusual Efectos secundarios que, por lo general, no requieren atencin mdica (debe informarlos a su mdico o a  su profesional de la salud si persisten o si son molestos): -diarrea -nuseas o malestar estomacal -manchas blancas o llagas dentro de la boca Puede ser que esta lista no menciona todos los posibles efectos secundarios. Comunquese a su mdico por asesoramiento mdico Hewlett-Packard. Usted puede informar los efectos secundarios a la FDA por telfono al 1-800-FDA-1088. Dnde debo guardar mi medicina? Mantngala fuera del alcance de los nios. Gurdela a Sanmina-SCI, a menos de 30 grados C (86 grados F). Mantenga el envase bien cerrado. Deseche todo el medicamento que no haya utilizado, despus de la fecha de vencimiento. ATENCIN: Este folleto es un resumen. Puede ser que no cubra toda la posible informacin. Si usted tiene preguntas acerca de esta medicina, consulte con su mdico, su farmacutico o su profesional de Radiographer, therapeutic.    2016, Elsevier/Gold Standard. (2014-05-18 00:00:00) Infeccin urinaria  (Urinary Tract Infection)  La infeccin urinaria puede ocurrir en cualquier lugar del tracto urinario. El tracto urinario es un sistema de drenaje del cuerpo por el que se eliminan los desechos y el exceso de [place]. El tracto urinario est formado por dos riones, dos urteres, la vejiga y Engineer, mining. Los riones son rganos que tienen forma de frijol. Cada rin tiene aproximadamente el tamao del puo. Estn situados debajo de las [place], uno a cada lado de la columna vertebral CAUSAS  La causa de la infeccin son los microbios, que son organismos microscpicos, que incluyen hongos, virus, y bacterias. Estos organismos son tan pequeos que slo pueden verse a travs del microscopio. Las bacterias son los microorganismos que ms comnmente causan infecciones urinarias.  SNTOMAS  Los sntomas pueden variar segn la edad y el sexo del paciente y por la ubicacin  de la infeccin. Los sntomas en las mujeres jvenes incluyen la necesidad frecuente e intensa de orinar y una  sensacin dolorosa de ardor en la vejiga o en la uretra durante la miccin. Las mujeres y los hombres mayores podrn sentir cansancio, temblores y debilidad y Futures trader musculares y Engineer, mining abdominal. Si tiene [place], puede significar que la infeccin est en los riones. Otros sntomas son dolor en la espalda o en los lados debajo de las [place], nuseas y vmitos.  DIAGNSTICO  Para diagnosticar una infeccin urinaria, el mdico le preguntar acerca de sus sntomas. Gen[employer]ine Parts una [place] de [place]. La muestra de orina se analiza para Engineer, manufacturing bacterias y glbulos blancos de Risk manager. Los glbulos blancos se forman en el organismo para ayudar a Artist las infecciones.  TRATAMIENTO  Por lo general, las infecciones urinarias pueden tratarse con medicamentos. Debido a que la Harley[employer]son de las infecciones son causadas por bacterias, por lo general pueden tratarse con antibiticos. La eleccin del antibitico y la duracin del tratamiento depender de sus sntomas y el tipo de bacteria causante de la infeccin.  INSTRUCCIONES PARA EL CUIDADO EN EL HOGAR   Si le recetaron antibiticos, tmelos exactamente como su mdico le indique. Termine el medicamento aunque se sienta mejor despus de haber tomado slo algunos.  Beba gran cantidad de lquido para mantener la orina de tono claro o color amarillo plido.  Evite la cafena, el t y las [address] Estas sustancias irritan la vejiga.  Vaciar la vejiga con frecuencia. Evite retener la orina durante largos perodos.  Vace la vejiga antes y despus de Management consultant.  Despus de mover el intestino, las mujeres deben higienizarse la regin perineal desde adelante hacia atrs. Use slo un papel tissue por vez. SOLICITE ATENCIN MDICA SI:   Siente dolor en la espalda.  Le sube la fiebre.  Los sntomas no mejoran luego de [address]. SOLICITE ATENCIN MDICA DE INMEDIATO SI:   Siente dolor intenso en la espalda o en la  zona inferior del abdomen.  Comienza a sentir escalofros.  Tiene nuseas o vmitos.  Tiene una sensacin continua de quemazn o molestias al [employer]. ASEGRESE DE QUE:   Comprende estas instrucciones.  Controlar su enfermedad.  Solicitar ayuda de inmediato si no mejora o empeora.   Esta informacin no tiene Theme park manager el consejo del mdico. Asegrese de hacerle al mdico cualquier pregunta que tenga.   Document Released: 01/03/2005 Document Revised: 12/19/2011 Elsevier Interactive Patient Education [employer]

## 2015-09-08 NOTE — Progress Notes (Signed)
   HPI: Patient is a 40 year old that presented to the office stating that after having had intercourse with her husband [facility] she been having low abdominal discomfort she is having some frequency but no true dysuria no vaginal discharge some vulvar pruritus and some mild back discomfort. She has a Mirena IUD and is not having any menstrual cycles. She denied any fever, chills, nausea, or vomiting. No GI complaints reported. Patient has been afebrile    ROS: A ROS was performed and pertinent positives and negatives are included in the history.  GENERAL: No fevers or chills. HEENT: No change in vision, no earache, sore throat or sinus congestion. NECK: No pain or stiffness. CARDIOVASCULAR: No chest pain or pressure. No palpitations. PULMONARY: No shortness of breath, cough or wheeze. GASTROINTESTINAL: No abdominal pain, nausea, vomiting or diarrhea, melena or bright red blood per rectum. GENITOURINARY: Frequency urgency, hesitancy or dysuria. MUSCULOSKELETAL: No joint or muscle pain, no back pain, no recent trauma. DERMATOLOGIC: No rash, no itching, no lesions. ENDOCRINE: No polyuria, polydipsia, no heat or cold intolerance. No recent change in weight. HEMATOLOGICAL: No anemia or easy bruising or bleeding. NEUROLOGIC: No headache, seizures, numbness, tingling or weakness. PSYCHIATRIC: No depression, no loss of interest in normal activity or change in sleep pattern.   PE: Blood pressure 116/78 Gen. appearance well-developed well-nourished female with the above-mentioned complaint no acute distress Abdomen: Soft nontender no rebound or guarding Pelvic: Bartholin urethra Skene was within normal limits Vagina: No lesions or discharge Cervix: No lesions or discharge Uterus: Anteverted normal size shape and consistency Adnexa: No palpable masses or tenderness Rectal exam: Not done  Wet prep few yeast, few white blood cell, few bacteria  Urinalysis: White blood cells 10-20, bacteria many   Assessment  Plan: Based on patient's symptoms and clinical findings it appears she has a urinary tract infection she'll be started on Cipro 250 mg twice a day for 3 days and Pyridium 200 mg 3 times a day for 3 days. Also for pruritus or be prescribed Diflucan 150 mg 1 by mouth today.    Greater than 50% of time was spent in counseling and coordinating care of this patient.   Time of consultation: 15   Minutes.

## 2015-09-10 LAB — URINE CULTURE: Colony Count: 30000

## 2016-06-13 ENCOUNTER — Other Ambulatory Visit: Payer: Self-pay | Admitting: Gynecology

## 2016-06-13 DIAGNOSIS — Z1231 Encounter for screening mammogram for malignant neoplasm of breast: Secondary | ICD-10-CM

## 2016-07-02 ENCOUNTER — Ambulatory Visit
Admission: RE | Admit: 2016-07-02 | Discharge: 2016-07-02 | Disposition: A | Discharge status: Home / Self Care | Payer: 59 | Source: Ambulatory Visit | Attending: Gynecology | Admitting: Gynecology

## 2016-07-02 DIAGNOSIS — Z1231 Encounter for screening mammogram for malignant neoplasm of breast: Secondary | ICD-10-CM

## 2016-08-02 ENCOUNTER — Encounter: Payer: Self-pay | Admitting: Gynecology

## 2016-08-02 ENCOUNTER — Ambulatory Visit (INDEPENDENT_AMBULATORY_CARE_PROVIDER_SITE_OTHER): Payer: 59 | Admitting: Gynecology

## 2016-08-02 VITALS — BP 118/76 | Ht 62.0 in | Wt 144.0 lb

## 2016-08-02 DIAGNOSIS — Z01419 Encounter for gynecological examination (general) (routine) without abnormal findings: Secondary | ICD-10-CM

## 2016-08-02 LAB — URINALYSIS W MICROSCOPIC + REFLEX CULTURE
BACTERIA UA: NONE SEEN [HPF]
BILIRUBIN URINE: NEGATIVE
CASTS: NONE SEEN [LPF]
Crystals: NONE SEEN [HPF]
Glucose, UA: NEGATIVE
Hgb urine dipstick: NEGATIVE
KETONES UR: NEGATIVE
Leukocytes, UA: NEGATIVE
Nitrite: NEGATIVE
PROTEIN: NEGATIVE
RBC / HPF: NONE SEEN RBC/HPF (ref <=2)
SPECIFIC GRAVITY, URINE: 1.024 (ref 1.001–1.035)
WBC, UA: NONE SEEN WBC/HPF (ref <=5)
Yeast: NONE SEEN [HPF]
pH: 6 (ref 5.0–8.0)

## 2016-08-02 LAB — CBC WITH DIFFERENTIAL/PLATELET
Basophils Absolute: 0 cells/uL (ref 0–200)
Basophils Relative: 0 %
Eosinophils Absolute: 142 cells/uL (ref 15–500)
Eosinophils Relative: 2 %
HEMATOCRIT: 40.5 % (ref 35.0–45.0)
Hemoglobin: 13.6 g/dL (ref 11.7–15.5)
LYMPHS ABS: 1491 {cells}/uL (ref 850–3900)
Lymphocytes Relative: 21 %
MCH: 30.2 pg (ref 27.0–33.0)
MCHC: 33.6 g/dL (ref 32.0–36.0)
MCV: 89.8 fL (ref 80.0–100.0)
MONOS PCT: 7 %
MPV: 11 fL (ref 7.5–12.5)
Monocytes Absolute: 497 cells/uL (ref 200–950)
NEUTROS ABS: 4970 {cells}/uL (ref 1500–7800)
Neutrophils Relative %: 70 %
Platelets: 208 10*3/uL (ref 140–400)
RBC: 4.51 MIL/uL (ref 3.80–5.10)
RDW: 13.6 % (ref 11.0–15.0)
WBC: 7.1 10*3/uL (ref 3.8–10.8)

## 2016-08-02 LAB — TSH: TSH: 0.52 m[IU]/L

## 2016-08-02 LAB — COMPREHENSIVE METABOLIC PANEL
ALK PHOS: 43 U/L (ref 33–115)
ALT: 12 U/L (ref 6–29)
AST: 14 U/L (ref 10–30)
Albumin: 4.3 g/dL (ref 3.6–5.1)
BUN: 17 mg/dL (ref 7–25)
CALCIUM: 9 mg/dL (ref 8.6–10.2)
CHLORIDE: 104 mmol/L (ref 98–110)
CO2: 19 mmol/L — ABNORMAL LOW (ref 20–31)
Creat: 0.65 mg/dL (ref 0.50–1.10)
GLUCOSE: 86 mg/dL (ref 65–99)
POTASSIUM: 4 mmol/L (ref 3.5–5.3)
Sodium: 138 mmol/L (ref 135–146)
Total Bilirubin: 0.4 mg/dL (ref 0.2–1.2)
Total Protein: 6.4 g/dL (ref 6.1–8.1)

## 2016-08-02 LAB — LIPID PANEL
CHOL/HDL RATIO: 3.8 ratio (ref <5.0)
CHOLESTEROL: 183 mg/dL (ref <200)
HDL: 48 mg/dL — AB (ref >50)
LDL Cholesterol: 104 mg/dL — ABNORMAL HIGH (ref <100)
Triglycerides: 157 mg/dL — ABNORMAL HIGH (ref <150)
VLDL: 31 mg/dL — AB (ref <30)

## 2016-08-02 NOTE — Addendum Note (Signed)
Addended by: Dayna Barker on: 08/02/2016 10:31 AM   Modules accepted: Orders

## 2016-08-02 NOTE — Progress Notes (Signed)
Cheryl [name] 1975-12-19 [id number]   History:    41 y.o.  for annual gyn exam with no complaints today. Patient has a Mirena IUD that was placed in 2014 due to be changed in 2019. Patient reports very little if any menstrual cycles. Patient with no previous history of any abnormal Pap smear. Patient did receive the flu vaccine within the past 12 months.  Past medical history,surgical history, family history and social history were all reviewed and documented in the EPIC chart.  Gynecologic History No LMP recorded. Patient is not currently having periods (Reason: IUD). Contraception: IUD Last Pap: 2015. Results were: normal Last mammogram: Not indicated. Results were: Not indicated  Obstetric History OB History  Gravida Para Term Preterm AB Living  SAB TAB Ectopic Multiple Live Births          3    # Outcome Date GA Lbr Len/2nd Weight Sex Delivery Anes PTL Lv  3 Term     M Vag-Spont   LIV  2 Term     M Vag-Spont   LIV  1 Term     F Vag-Spont   LIV       ROS: A ROS was performed and pertinent positives and negatives are included in the history.  GENERAL: No fevers or chills. HEENT: No change in vision, no earache, sore throat or sinus congestion. NECK: No pain or stiffness. CARDIOVASCULAR: No chest pain or pressure. No palpitations. PULMONARY: No shortness of breath, cough or wheeze. GASTROINTESTINAL: No abdominal pain, nausea, vomiting or diarrhea, melena or bright red blood per rectum. GENITOURINARY: No urinary frequency, urgency, hesitancy or dysuria. MUSCULOSKELETAL: No joint or muscle pain, no back pain, no recent trauma. DERMATOLOGIC: No rash, no itching, no lesions. ENDOCRINE: No polyuria, polydipsia, no heat or cold intolerance. No recent change in weight. HEMATOLOGICAL: No anemia or easy bruising or bleeding. NEUROLOGIC: No headache, seizures, numbness, tingling or weakness. PSYCHIATRIC: No depression, no loss of interest in normal activity or change in sleep  pattern.     Exam: chaperone present  BP 118/76   Ht  (1.575 m)   Wt 144 lb (65.3 kg)   BMI 26.34 kg/m   Body mass index is 26.34 kg/m.  General appearance : Well developed well nourished female. No acute distress HEENT: Eyes: no retinal hemorrhage or exudates,  Neck supple, trachea midline, no carotid bruits, no thyroidmegaly Lungs: Clear to auscultation, no rhonchi or wheezes, or rib retractions  Heart: Regular rate and rhythm, no murmurs or gallops Breast:Examined in sitting and supine position were symmetrical in appearance, no palpable masses or tenderness,  no skin retraction, no nipple inversion, no nipple discharge, no skin discoloration, no axillary or supraclavicular lymphadenopathy Abdomen: no palpable masses or tenderness, no rebound or guarding Extremities: no edema or skin discoloration or tenderness  Pelvic:  Bartholin, Urethra, Skene Glands: Within normal limits             Vagina: No gross lesions or discharge  Cervix: No gross lesions or discharge, IUD string visualized  Uterus  anteverted, normal size, shape and consistency, non-tender and mobile  Adnexa  Without masses or tenderness  Anus and perineum  normal   Rectovaginal  normal sphincter tone without palpated masses or tenderness             Hemoccult not indicated     Assessment/Plan:  41 y.o. female for annual exam doing well will have the following  fasting blood work drawn today: Comprehensive metabolic panel, fasting lipid profile, CBC, TSH, and urinalysis. Pap smear was done today.   Ok Edwards MD, 9:35 AM 08/02/2016

## 2016-08-07 LAB — PAP IG W/ RFLX HPV ASCU

## 2016-08-22 ENCOUNTER — Encounter: Payer: Self-pay | Admitting: Gynecology

## 2017-06-28 ENCOUNTER — Other Ambulatory Visit: Payer: Self-pay | Admitting: Women's Health

## 2017-06-28 ENCOUNTER — Other Ambulatory Visit: Payer: Self-pay | Admitting: Obstetrics & Gynecology

## 2017-06-28 DIAGNOSIS — Z1231 Encounter for screening mammogram for malignant neoplasm of breast: Secondary | ICD-10-CM

## 2017-07-16 ENCOUNTER — Ambulatory Visit
Admission: RE | Admit: 2017-07-16 | Discharge: 2017-07-16 | Disposition: A | Discharge status: Home / Self Care | Payer: 59 | Source: Ambulatory Visit | Attending: Women's Health | Admitting: Women's Health

## 2017-07-16 DIAGNOSIS — Z1231 Encounter for screening mammogram for malignant neoplasm of breast: Secondary | ICD-10-CM

## 2017-08-21 ENCOUNTER — Encounter: Payer: Self-pay | Admitting: Women's Health

## 2017-08-21 ENCOUNTER — Ambulatory Visit (INDEPENDENT_AMBULATORY_CARE_PROVIDER_SITE_OTHER): Payer: 59 | Admitting: Women's Health

## 2017-08-21 VITALS — BP 122/80 | Ht 62.0 in | Wt 149.0 lb

## 2017-08-21 DIAGNOSIS — Z01419 Encounter for gynecological examination (general) (routine) without abnormal findings: Secondary | ICD-10-CM

## 2017-08-21 LAB — CBC WITH DIFFERENTIAL/PLATELET
BASOS PCT: 0.6 %
Basophils Absolute: 37 cells/uL (ref 0–200)
EOS ABS: 130 {cells}/uL (ref 15–500)
Eosinophils Relative: 2.1 %
HCT: 38.9 % (ref 35.0–45.0)
HEMOGLOBIN: 13.5 g/dL (ref 11.7–15.5)
Lymphs Abs: 1364 cells/uL (ref 850–3900)
MCH: 29.9 pg (ref 27.0–33.0)
MCHC: 34.7 g/dL (ref 32.0–36.0)
MCV: 86.3 fL (ref 80.0–100.0)
MONOS PCT: 8.5 %
MPV: 11.2 fL (ref 7.5–12.5)
NEUTROS ABS: 4142 {cells}/uL (ref 1500–7800)
Neutrophils Relative %: 66.8 %
PLATELETS: 297 10*3/uL (ref 140–400)
RBC: 4.51 10*6/uL (ref 3.80–5.10)
RDW: 13 % (ref 11.0–15.0)
Total Lymphocyte: 22 %
WBC mixed population: 527 cells/uL (ref 200–950)
WBC: 6.2 10*3/uL (ref 3.8–10.8)

## 2017-08-21 LAB — COMPREHENSIVE METABOLIC PANEL
AG RATIO: 1.8 (calc) (ref 1.0–2.5)
ALBUMIN MSPROF: 4.5 g/dL (ref 3.6–5.1)
ALT: 16 U/L (ref 6–29)
AST: 18 U/L (ref 10–30)
Alkaline phosphatase (APISO): 53 U/L (ref 33–115)
BUN: 13 mg/dL (ref 7–25)
CALCIUM: 9.6 mg/dL (ref 8.6–10.2)
CHLORIDE: 104 mmol/L (ref 98–110)
CO2: 29 mmol/L (ref 20–32)
Creat: 0.75 mg/dL (ref 0.50–1.10)
Globulin: 2.5 g/dL (calc) (ref 1.9–3.7)
Glucose, Bld: 96 mg/dL (ref 65–99)
Potassium: 3.9 mmol/L (ref 3.5–5.3)
Sodium: 138 mmol/L (ref 135–146)
TOTAL PROTEIN: 7 g/dL (ref 6.1–8.1)
Total Bilirubin: 0.3 mg/dL (ref 0.2–1.2)

## 2017-08-21 NOTE — Patient Instructions (Addendum)
Health Maintenance, Female Adopting a healthy lifestyle and getting preventive care can go a long way to promote health and wellness. Talk with your health care provider about what schedule of regular examinations is right for you. This is a good chance for you to check in with your provider about disease prevention and staying healthy. In between checkups, there are plenty of things you can do on your own. Experts have done a lot of research about which lifestyle changes and preventive measures are most likely to keep you healthy. Ask your health care provider for more information. Weight and diet Eat a healthy diet  Be sure to include plenty of vegetables, fruits, low-fat dairy products, and lean protein.  Do not eat a lot of foods high in solid fats, added sugars, or salt.  Get regular exercise. This is one of the most important things you can do for your health. ? Most adults should exercise for at least 150 minutes each week. The exercise should increase your heart rate and make you sweat (moderate-intensity exercise). ? Most adults should also do strengthening exercises at least twice a week. This is in addition to the moderate-intensity exercise.  Maintain a healthy weight  Body mass index (BMI) is a measurement that can be used to identify possible weight problems. It estimates body fat based on height and weight. Your health care provider can help determine your BMI and help you achieve or maintain a healthy weight.  For females 20 years of age and older: ? A BMI below 18.5 is considered underweight. ? A BMI of 18.5 to 24.9 is normal. ? A BMI of 25 to 29.9 is considered overweight. ? A BMI of 30 and above is considered obese.  Watch levels of cholesterol and blood lipids  You should start having your blood tested for lipids and cholesterol at 43 years of age, then have this test every 5 years.  You may need to have your cholesterol levels checked more often if: ? Your lipid or  cholesterol levels are high. ? You are older than 43 years of age. ? You are at high risk for heart disease.  Cancer screening Lung Cancer  Lung cancer screening is recommended for adults 55-80 years old who are at high risk for lung cancer because of a history of smoking.  A yearly low-dose CT scan of the lungs is recommended for people who: ? Currently smoke. ? Have quit within the past 15 years. ? Have at least a 30-pack-year history of smoking. A pack year is smoking an average of one pack of cigarettes a day for 1 year.  Yearly screening should continue until it has been 15 years since you quit.  Yearly screening should stop if you develop a health problem that would prevent you from having lung cancer treatment.  Breast Cancer  Practice breast self-awareness. This means understanding how your breasts normally appear and feel.  It also means doing regular breast self-exams. Let your health care provider know about any changes, no matter how small.  If you are in your 20s or 30s, you should have a clinical breast exam (CBE) by a health care provider every 1-3 years as part of a regular health exam.  If you are 40 or older, have a CBE every year. Also consider having a breast X-ray (mammogram) every year.  If you have a family history of breast cancer, talk to your health care provider about genetic screening.  If you are at high risk   for breast cancer, talk to your health care provider about having an MRI and a mammogram every year.  Breast cancer gene (BRCA) assessment is recommended for women who have family members with BRCA-related cancers. BRCA-related cancers include: ? Breast. ? Ovarian. ? Tubal. ? Peritoneal cancers.  Results of the assessment will determine the need for genetic counseling and BRCA1 and BRCA2 testing.  Cervical Cancer Your health care provider may recommend that you be screened regularly for cancer of the pelvic organs (ovaries, uterus, and  vagina). This screening involves a pelvic examination, including checking for microscopic changes to the surface of your cervix (Pap test). You may be encouraged to have this screening done every 3 years, beginning at age 22.  For women ages 56-65, health care providers may recommend pelvic exams and Pap testing every 3 years, or they may recommend the Pap and pelvic exam, combined with testing for human papilloma virus (HPV), every 5 years. Some types of HPV increase your risk of cervical cancer. Testing for HPV may also be done on women of any age with unclear Pap test results.  Other health care providers may not recommend any screening for nonpregnant women who are considered low risk for pelvic cancer and who do not have symptoms. Ask your health care provider if a screening pelvic exam is right for you.  If you have had past treatment for cervical cancer or a condition that could lead to cancer, you need Pap tests and screening for cancer for at least 20 years after your treatment. If Pap tests have been discontinued, your risk factors (such as having a new sexual partner) need to be reassessed to determine if screening should resume. Some women have medical problems that increase the chance of getting cervical cancer. In these cases, your health care provider may recommend more frequent screening and Pap tests.  Colorectal Cancer  This type of cancer can be detected and often prevented.  Routine colorectal cancer screening usually begins at 43 years of age and continues through 43 years of age.  Your health care provider may recommend screening at an earlier age if you have risk factors for colon cancer.  Your health care provider may also recommend using home test kits to check for hidden blood in the stool.  A small camera at the end of a tube can be used to examine your colon directly (sigmoidoscopy or colonoscopy). This is done to check for the earliest forms of colorectal  cancer.  Routine screening usually begins at age 33.  Direct examination of the colon should be repeated every 5-10 years through 43 years of age. However, you may need to be screened more often if early forms of precancerous polyps or small growths are found.  Skin Cancer  Check your skin from head to toe regularly.  Tell your health care provider about any new moles or changes in moles, especially if there is a change in a mole's shape or color.  Also tell your health care provider if you have a mole that is larger than the size of a pencil eraser.  Always use sunscreen. Apply sunscreen liberally and repeatedly throughout the day.  Protect yourself by wearing long sleeves, pants, a wide-brimmed hat, and sunglasses whenever you are outside.  Heart disease, diabetes, and high blood pressure  High blood pressure causes heart disease and increases the risk of stroke. High blood pressure is more likely to develop in: ? People who have blood pressure in the high end of  the normal range (130-139/85-89 mm Hg). ? People who are overweight or obese. ? People who are African American.  If you are 21-29 years of age, have your blood pressure checked every 3-5 years. If you are 3 years of age or older, have your blood pressure checked every year. You should have your blood pressure measured twice-once when you are at a hospital or clinic, and once when you are not at a hospital or clinic. Record the average of the two measurements. To check your blood pressure when you are not at a hospital or clinic, you can use: ? An automated blood pressure machine at a pharmacy. ? A home blood pressure monitor.  If you are between 17 years and 37 years old, ask your health care provider if you should take aspirin to prevent strokes.  Have regular diabetes screenings. This involves taking a blood sample to check your fasting blood sugar level. ? If you are at a normal weight and have a low risk for diabetes,  have this test once every three years after 43 years of age. ? If you are overweight and have a high risk for diabetes, consider being tested at a younger age or more often. Preventing infection Hepatitis B  If you have a higher risk for hepatitis B, you should be screened for this virus. You are considered at high risk for hepatitis B if: ? You were born in a country where hepatitis B is common. Ask your health care provider which countries are considered high risk. ? Your parents were born in a high-risk country, and you have not been immunized against hepatitis B (hepatitis B vaccine). ? You have HIV or AIDS. ? You use needles to inject street drugs. ? You live with someone who has hepatitis B. ? You have had sex with someone who has hepatitis B. ? You get hemodialysis treatment. ? You take certain medicines for conditions, including cancer, organ transplantation, and autoimmune conditions.  Hepatitis C  Blood testing is recommended for: ? Everyone born from [birth year] through 1966. ? Anyone with known risk factors for hepatitis C.  Sexually transmitted infections (STIs)  You should be screened for sexually transmitted infections (STIs) including gonorrhea and chlamydia if: ? You are sexually active and are younger than 43 years of age. ? You are older than 43 years of age and your health care provider tells you that you are at risk for this type of infection. ? Your sexual activity has changed since you were last screened and you are at an increased risk for chlamydia or gonorrhea. Ask your health care provider if you are at risk.  If you do not have HIV, but are at risk, it may be recommended that you take a prescription medicine daily to prevent HIV infection. This is called pre-exposure prophylaxis (PrEP). You are considered at risk if: ? You are sexually active and do not regularly use condoms or know the HIV status of your partner(s). ? You take drugs by injection. ? You are  sexually active with a partner who has HIV.  Talk with your health care provider about whether you are at high risk of being infected with HIV. If you choose to begin PrEP, you should first be tested for HIV. You should then be tested every 3 months for as long as you are taking PrEP. Pregnancy  If you are premenopausal and you may become pregnant, ask your health care provider about preconception counseling.  If you may become  pregnant, take 400 to 800 micrograms (mcg) of folic acid every day.  If you want to prevent pregnancy, talk to your health care provider about birth control (contraception). Osteoporosis and menopause  Osteoporosis is a disease in which the bones lose minerals and strength with aging. This can result in serious bone fractures. Your risk for osteoporosis can be identified using a bone density scan.  If you are 65 years of age or older, or if you are at risk for osteoporosis and fractures, ask your health care provider if you should be screened.  Ask your health care provider whether you should take a calcium or vitamin D supplement to lower your risk for osteoporosis.  Menopause may have certain physical symptoms and risks.  Hormone replacement therapy may reduce some of these symptoms and risks. Talk to your health care provider about whether hormone replacement therapy is right for you. Follow these instructions at home:  Schedule regular health, dental, and eye exams.  Stay current with your immunizations.  Do not use any tobacco products including cigarettes, chewing tobacco, or electronic cigarettes.  If you are pregnant, do not drink alcohol.  If you are breastfeeding, limit how much and how often you drink alcohol.  Limit alcohol intake to no more than 1 drink per day for nonpregnant women. One drink equals 12 ounces of beer, 5 ounces of wine, or 1 ounces of hard liquor.  Do not use street drugs.  Do not share needles.  Ask your health care  provider for help if you need support or information about quitting drugs.  Tell your health care provider if you often feel depressed.  Tell your health care provider if you have ever been abused or do not feel safe at home. This information is not intended to replace advice given to you by your health care provider. Make sure you discuss any questions you have with your health care provider. Document Released: 10/09/2010 Document Revised: 09/01/2015 Document Reviewed: 12/28/2014 Elsevier Interactive Patient Education  2018 Elsevier Inc. Levonorgestrel intrauterine device (IUD) What is this medicine? LEVONORGESTREL IUD (LEE voe nor jes trel) is a contraceptive (birth control) device. The device is placed inside the uterus by a healthcare professional. It is used to prevent pregnancy. This device can also be used to treat heavy bleeding that occurs during your period. This medicine may be used for other purposes; ask your health care provider or pharmacist if you have questions. COMMON BRAND NAME(S): Kyleena, LILETTA, Mirena, Skyla What should I tell my health care provider before I take this medicine? They need to know if you have any of these conditions: -abnormal Pap smear -cancer of the breast, uterus, or cervix -diabetes -endometritis -genital or pelvic infection now or in the past -have more than one sexual partner or your partner has more than one partner -heart disease -history of an ectopic or tubal pregnancy -immune system problems -IUD in place -liver disease or tumor -problems with blood clots or take blood-thinners -seizures -use intravenous drugs -uterus of unusual shape -vaginal bleeding that has not been explained -an unusual or allergic reaction to levonorgestrel, other hormones, silicone, or polyethylene, medicines, foods, dyes, or preservatives -pregnant or trying to get pregnant -breast-feeding How should I use this medicine? This device is placed inside the  uterus by a health care professional. Talk to your pediatrician regarding the use of this medicine in children. Special care may be needed. Overdosage: If you think you have taken too much of this medicine contact a   poison control center or emergency room at once. NOTE: This medicine is only for you. Do not share this medicine with others. What if I miss a dose? This does not apply. Depending on the brand of device you have inserted, the device will need to be replaced every 3 to 5 years if you wish to continue using this type of birth control. What may interact with this medicine? Do not take this medicine with any of the following medications: -amprenavir -bosentan -fosamprenavir This medicine may also interact with the following medications: -aprepitant -armodafinil -barbiturate medicines for inducing sleep or treating seizures -bexarotene -boceprevir -griseofulvin -medicines to treat seizures like carbamazepine, ethotoin, felbamate, oxcarbazepine, phenytoin, topiramate -modafinil -pioglitazone -rifabutin -rifampin -rifapentine -some medicines to treat HIV infection like atazanavir, efavirenz, indinavir, lopinavir, nelfinavir, tipranavir, ritonavir -St. John's wort -warfarin This list may not describe all possible interactions. Give your health care provider a list of all the medicines, herbs, non-prescription drugs, or dietary supplements you use. Also tell them if you smoke, drink alcohol, or use illegal drugs. Some items may interact with your medicine. What should I watch for while using this medicine? Visit your doctor or health care professional for regular check ups. See your doctor if you or your partner has sexual contact with others, becomes HIV positive, or gets a sexual transmitted disease. This product does not protect you against HIV infection (AIDS) or other sexually transmitted diseases. You can check the placement of the IUD yourself by reaching up to the top of  your vagina with clean fingers to feel the threads. Do not pull on the threads. It is a good habit to check placement after each menstrual period. Call your doctor right away if you feel more of the IUD than just the threads or if you cannot feel the threads at all. The IUD may come out by itself. You may become pregnant if the device comes out. If you notice that the IUD has come out use a backup birth control method like condoms and call your health care provider. Using tampons will not change the position of the IUD and are okay to use during your period. This IUD can be safely scanned with magnetic resonance imaging (MRI) only under specific conditions. Before you have an MRI, tell your healthcare provider that you have an IUD in place, and which type of IUD you have in place. What side effects may I notice from receiving this medicine? Side effects that you should report to your doctor or health care professional as soon as possible: -allergic reactions like skin rash, itching or hives, swelling of the face, lips, or tongue -fever, flu-like symptoms -genital sores -high blood pressure -no menstrual period for 6 weeks during use -pain, swelling, warmth in the leg -pelvic pain or tenderness -severe or sudden headache -signs of pregnancy -stomach cramping -sudden shortness of breath -trouble with balance, talking, or walking -unusual vaginal bleeding, discharge -yellowing of the eyes or skin Side effects that usually do not require medical attention (report to your doctor or health care professional if they continue or are bothersome): -acne -breast pain -change in sex drive or performance -changes in weight -cramping, dizziness, or faintness while the device is being inserted -headache -irregular menstrual bleeding within first 3 to 6 months of use -nausea This list may not describe all possible side effects. Call your doctor for medical advice about side effects. You may report side  effects to FDA at 1-800-FDA-1088. Where should I keep my medicine? This does   not apply. NOTE: This sheet is a summary. It may not cover all possible information. If you have questions about this medicine, talk to your doctor, pharmacist, or health care provider.  2018 Elsevier/Gold Standard (2016-01-06 14:14:56) Levonorgestrel intrauterine device (IUD) O que  este medicamento? O LEVONORGESTREL IUD (DIU)  um dispositivo contraceptivo (anticoncepcional). Este dispositivo  colocado dentro do tero por um profissional de sade.  usado para prevenir a Occupational hygienist. Este dispositivo tambm pode ser utilizado para tratar a hemorragia intensa que ocorre durante o seu perodo menstrual. Este medicamento pode ser usado para outros propsitos; em caso de dvidas, pergunte ao seu profissional de sade ou Development worker, international aid. NOMES DE MARCAS COMUNS: Verdia Kuba, LILETTA, Mirena, Skyla O que devo dizer a meu profissional de sade antes de tomar este medicamento? Precisam saber se voc tem algum dos seguintes problemas ou estados de sade: -Papanicolau anormal -cncer de mama, do tero ou do colo do tero -diabetes -endometrite -infeco genital ou plvica atual ou no passado -tem mais de um parceiro sexual ou seu parceiro tem mais de um parceiro sexual -doenas cardacas -histrico de Occupational hygienist ectpica ou tubria -problemas do sistema imunitrio -DIU implantado -doena ou tumor no fgado -problemas com cogulos sanguneos ou tomando medicamentos que afinam o sangue -convulses (crises convulsivas) -[place] drogas injetveis -tero com formato irregular -sangramento vaginal que ainda no foi explicado -reao estranha ou alergia ao levonorgestrel, a outros hormnios, ao silicone ou ao polietileno -reao estranha ou alergia a outros medicamentos, alimentos, corantes ou conservantes -est grvida ou tentando Hospital doctor -est amamentando Como devo usar este medicamento? Este dispositivo  colocado dentro do tero  por um profissional de sade. Fale com seu pediatra a respeito do uso deste medicamento em crianas. Pode ser preciso tomar alguns cuidados especiais. Superdosagem: Se achar que tomou uma superdosagem deste medicamento, entre em contato imediatamente com o [facility] ou v a [employer]. OBSERVAO: Este medicamento  s para voc. No compartilhe este medicamento com outras pessoas. E se eu deixar de tomar uma dose? Isto no se aplica. Se deseja continuar usando este tipo de [employer], ele dever ser substitudo a cada 3 a 5 anos, dependendo da marca do dispositivo inserido. O que pode interagir com este medicamento? No tome este medicamento com nenhum dos seguintes: -amprenavir -bosentana -fosamprenavir Este medicamento tambm pode interagir com os seguintes remdios: -aprepitanto -armodafinila -barbitricos para dormir ou para tratar crises convulsivas -bexaroteno -boceprevir -griseofulvina -alguns medicamentos para crises convulsivas, como carbamazepina, etotona, [place], [place], [place], topiramato -modafinila -pioglitazona -rifabutina -rifampicina -rifapentina -alguns medicamentos contra a infeco pelo HIV, como atazanavir, efavirenz, indinavir, lopinavir, nelfinavir, tipranavir e ritonavir -erva-de-so-joo -varfarina Esta lista pode no descrever todas as interaes possveis. D ao seu profissional de sade uma lista de todos os medicamentos, ervas medicinais, remdios de venda livre, ou suplementos alimentares que voc [place]. Diga tambm se voc fuma, bebe, ou [place] drogas ilcitas. Alguns destes podem interagir com o seu medicamento. Ao que devo ficar atento quando estiver [employer] medicamento? Consulte seu mdico ou profissional de sade para acompanhamento regular Museum/gallery curator. Consulte o seu mdico se voc ou seu parceiro tiverem contato sexual com outras pessoas, se tornar-se HIV positiva ou se contrair uma doena  transmitida sexualmente. Este medicamento no protege contra uma possvel infeco pelo HIV, AIDS ou outras doenas sexualmente transmissveis. Voc pode verificar a colocao do DIU tocando a parte superior da vagina com os dedos limpos para apalpar os fios. No puxe os fios.  um bom hbito verificar o posicionamento do  dispositivo aps cada perodo menstrual. Entre imediatamente em contato com o seu mdico ou profissional de sade se seus dedos apalparem mais do corpo do DIU do que apenas os fios ou se no puder apalpar os fios. O DIU pode sair do corpo. Voc pode engravidar se o dispositivo sair. Se voc notar que o DIU est deslocado, use um mtodo anticoncepcional secundrio, como preservativos, e entre em contato com o seu mdico ou profissional de sade. Absorventes internos no interferem com a posio do DIU e podem ser usados durante o seu perodo menstrual. Portadoras deste DIU s podem passar por uma ressonncia magntica (RM) com segurana sob condies especficas. Antes de passar por uma RM, informe o seu mdico ou profissional de sade que tem um DIU implantado e qual  o tipo do dispositivo. Que efeitos colaterais posso sentir aps usar este medicamento? Efeitos colaterais que devem ser informados ao seu mdico ou profissional de sade o mais rpido possvel: -reaes alrgicas, como erupo na pele, coceira, urticria, ou inchao do rosto, dos lbios ou da lngua -febre ou sintomas gripais -lceras genitais -presso alta -ausncia de menstruao por 6 semanas durante o uso -dor, inchao, calor na perna -dor plvica ou sensibilidade ao toque -dor de cabea forte ou sbita -sinais de gravidez -clicas abdominais -falta de ar sbita -dificuldades de equilbrio, para falar, para andar -sangramento ou corrimento vaginal fora do comum -olhos ou pele amarelados Efeitos colaterais que normalmente no precisam de cuidados mdicos (avise ao seu mdico ou profissional de sade se  persistirem ou forem incmodos): -acne -dor nos seios -mudana na libido ou no desempenho sexual -ganho ou perda de peso -clicas, tonturas ou desmaio durante a insero do dispositivo -dor de cabea -menstruao irregular durante os primeiros 3 a 6 meses de uso -enjoo Esta lista pode no descrever todos os efeitos colaterais possveis. Para mais orientaes sobre efeitos colaterais, consulte o seu mdico. Voc pode relatar a ocorrncia de efeitos colaterais  FDA pelo telefone [phone]. Onde devo guardar meu medicamento? Isto no se aplica. OBSERVAO: Este folheto  um resumo. Pode no cobrir todas as informaes possveis. Se tiver dvidas a respeito deste medicamento, fale com seu mdico, farmacutico ou profissional de sade.  2018 Elsevier/Gold Standard (2016-04-26 00:00:00) Mantenimiento de la salud - Mujeres (Health Maintenance, Female) Un estilo de vida saludable y los cuidados preventivos pueden favorecer considerablemente a la salud y Musician. Pregunte a su mdico cul es el cronograma de exmenes peridicos apropiado para usted. Esta es una buena oportunidad para consultarlo sobre cmo prevenir enfermedades y [place] sano. Adems de los controles, hay muchas otras cosas que puede hacer usted mismo. Los expertos han realizado numerosas investigaciones [employer] cambios en el estilo de vida y las medidas de prevencin que, [place], lo ayudarn a mantenerse sano. Solicite a su mdico ms informacin. EL PESO Y LA DIETA Consuma una dieta saludable.  Asegrese de [employer] verduras, frutas, productos lcteos de bajo contenido de [place] y Advertising account planner.  No consuma muchos alimentos de alto contenido de grasas slidas, azcares agregados o sal.  Realice actividad fsica con regularidad. Esta es una de las prcticas ms importantes que puede hacer por su salud. ? La Delorise Shiner de los adultos deben hacer ejercicio durante al menos 148mnutos por semana. El  ejercicio debe aumentar la frecuencia cardaca y pActorla transpiracin (ejercicio de [place]. ? La mayora de los adultos tambin deben hacer ejercicios de elongacin al mToysRusveces a la semana. Agregue esto al su plan de  ejercicio de [employer]. Mantenga un peso saludable.  El ndice de masa corporal [facility]) es una medida que puede utilizarse para identificar posibles problemas de [place]. Proporciona una estimacin de la grasa corporal basndose en el peso y la altura. Su mdico puede ayudarle a Radiation protection practitioner [place] y a Scientist, forensic o Theatre manager un peso saludable.  Para las mujeres de 20aos o ms: ? Un [facility] menor de 18,5 se considera bajo peso. ? Un [facility] entre 18,5 y 24,9 es normal. ? Un [facility] entre 25 y 29,9 se considera sobrepeso. ? Un IMC de 30 o ms se considera obesidad. Observe los niveles de colesterol y lpidos en la sangre.  Debe comenzar a English as a second language teacher de lpidos y Research officer, trade union en la sangre a los 20aos y luego repetirlos cada 88aos.  Es posible que Automotive engineer los niveles de colesterol con mayor frecuencia si: ? Sus niveles de lpidos y colesterol son altos. ? Es mayor de 73ZHG. ? Presenta un alto riesgo de padecer enfermedades cardacas. DETECCIN DE CNCER Cncer de pulmn  Se recomienda realizar exmenes de deteccin de cncer de pulmn a personas adultas entre 68 y 76 aos que estn en riesgo de Horticulturist, commercial de pulmn por sus antecedentes de consumo de tabaco.  Se recomienda una tomografa computarizada de baja dosis de los pulmones todos los aos a las personas que: ? Fuman actualmente. ? Hayan dejado el hbito en algn momento en los ltimos 15aos. ? Hayan fumado durante 30aos un paquete diario. Un paquete-ao equivale a fumar un promedio de un paquete de cigarrillos diario durante un ao.  Los exmenes de deteccin anuales deben continuar hasta que hayan pasado 15aos desde que dej de fumar.  Ya no debern realizarse si tiene  un problema de salud que le impida recibir tratamiento para Science writer de pulmn. Cncer de mama  Practique la autoconciencia de la mama. Esto significa reconocer la apariencia normal de sus mamas y cmo las siente.  Tambin significa realizar autoexmenes regulares de [employer]. Informe a su mdico sobre cualquier cambio, sin importar cun pequeo sea.  Si tiene entre 20 y 60 aos, un mdico debe realizarle un examen clnico de las mamas como parte del examen regular de [place], cada 1 a 3aos.  Si tiene 40aos o ms, debe Information systems manager clnico de las Microsoft. Tambin considere realizarse una [place] todos los [employer].  Si tiene antecedentes familiares de cncer de mama, hable con su mdico para someterse a un estudio gentico.  Si tiene alto riesgo de Chief Financial Officer de mama, hable con su mdico para someterse a Public house manager y [employer].  La evaluacin del gen del cncer de mama (BRCA) se recomienda a mujeres que tengan familiares con cnceres relacionados con el BRCA. Los cnceres relacionados con el BRCA incluyen los siguientes: ? [place]. ? Ovario. ? Trompas. ? Cnceres de peritoneo.  Los resultados de la evaluacin determinarn la necesidad de asesoramiento gentico y de [place] de BRCA1 y BRCA2. Cncer de cuello del tero El mdico puede recomendarle que se haga pruebas peridicas de deteccin de cncer de los rganos de la pelvis (ovarios, tero y vagina). Estas pruebas incluyen un examen plvico, que abarca controlar si se produjeron cambios microscpicos en la superficie del cuello del tero (prueba de Papanicolaou). Pueden recomendarle que se haga estas pruebas cada 3aos, a partir de los 21aos.  A las mujeres que [employer] 63 y 62aos, los mdicos pueden recomendarles que  se sometan a exmenes plvicos y pruebas de Papanicolaou cada 3aos, o a la prueba de Papanicolaou y el examen plvico en  combinacin con estudios de deteccin del virus del papiloma humano (VPH) cada 5aos. Algunos tipos de VPH aumentan el riesgo de Chief Financial Officer de cuello del tero. La prueba para la deteccin del VPH tambin puede realizarse a mujeres de cualquier edad cuyos resultados de la prueba de Papanicolaou no sean claros.  Es posible que otros mdicos no recomienden exmenes de deteccin a mujeres no embarazadas que se consideran sujetos de bajo riesgo de Chief Financial Officer de pelvis y que no tienen sntomas. Pregntele al mdico si un examen plvico de deteccin es adecuado para usted.  Si ha recibido un tratamiento para Science writer cervical o una enfermedad que podra causar cncer, necesitar realizarse una prueba de Papanicolaou y controles durante al menos 71 aos de concluido el [place]. Si no se ha hecho el Papanicolaou con regularidad, debern volver a evaluarse los factores de riesgo (como tener un nuevo compaero sexual), para Teacher, adult education si debe realizarse los estudios nuevamente. Algunas mujeres sufren problemas mdicos que aumentan la probabilidad de Museum/gallery curator cncer de cuello del tero. En estos casos, el mdico podr [employer] se realicen controles y pruebas de Papanicolaou con ms frecuencia. Cncer colorrectal  Este tipo de cncer puede detectarse y a menudo prevenirse.  Por lo general, los estudios de rutina se deben Medical laboratory scientific officer a Field seismologist a Proofreader de los 50 aos y [place] 76 aos.  Sin embargo, el mdico podr aconsejarle que lo haga antes, si tiene factores de riesgo para el cncer de colon.  Tambin puede recomendarle que use un kit de prueba para Hydrologist en la materia fecal.  Es posible que se use una pequea cmara en el extremo de un tubo para examinar directamente el colon (sigmoidoscopia o colonoscopia) a fin de Hydrographic surveyor formas tempranas de cncer colorrectal.  Los exmenes de rutina generalmente comienzan a los 29aos.  El examen directo del colon se debe repetir cada 5  a 10aos hasta los 75aos. Sin embargo, es posible que se realicen exmenes con mayor frecuencia, si se detectan formas tempranas de plipos precancerosos o pequeos bultos. Cncer de piel  Revise la piel de la cabeza a los pies con regularidad.  Informe a su mdico si aparecen nuevos lunares o los que tiene se modifican, especialmente en su forma y color.  Tambin notifique al mdico si tiene un lunar que es ms grande que el tamao de una goma de lpiz.  Siempre use pantalla solar. Aplique pantalla solar de [place] y repetida a lo largo del Training and development officer.  Protjase usando mangas y [employer], un sombrero de ala ancha y gafas para el sol, siempre que se encuentre en el exterior. ENFERMEDADES CARDACAS, DIABETES E HIPERTENSIN ARTERIAL  La hipertensin arterial causa enfermedades cardacas y [place] el riesgo de ictus. La hipertensin arterial es ms probable en los siguientes casos: ? Las personas que tienen la presin arterial en el extremo del rango normal (100-139/85-89 mm Hg). ? [employer] con sobrepeso u obesidad. ? Scientist, water quality.  Si usted tiene entre 18 y 39 aos, debe medirse la presin arterial cada 3 a 5 aos. Si usted tiene 40 aos o ms, debe medirse la presin arterial [employer]. Debe medirse la presin arterial dos veces: una vez cuando est en un hospital o una clnica y la otra vez cuando est en otro sitio. Registre el promedio de [employer]. Para  controlar su presin arterial cuando no est en un hospital o [place], puede usar lo siguiente: ? Jorje Guild automtica para medir la presin arterial en una farmacia. ? Un monitor para medir la presin arterial en el hogar.  Si tiene entre 48 y 6 aos, consulte a su mdico si debe tomar aspirina para prevenir el ictus.  Realcese exmenes de deteccin de la diabetes con regularidad. Esto incluye la toma de [place] de sangre para controlar el nivel de azcar en la sangre durante el  [place]. ? Si tiene un peso normal y un bajo riesgo de padecer diabetes, realcese este anlisis cada tres aos despus de los 45aos. ? Si tiene sobrepeso y un alto riesgo de padecer diabetes, considere someterse a este anlisis antes o con mayor frecuencia. PREVENCIN DE INFECCIONES HepatitisB  Si tiene un riesgo ms alto de Museum/gallery curator hepatitis B, debe someterse a un examen de deteccin de este virus. Se considera que tiene un alto riesgo de contraer hepatitis B si: ? Naci en un pas donde la hepatitis B es frecuente. Pregntele a su mdico qu pases son considerados de Public affairs consultant. ? Sus padres nacieron en un pas de alto riesgo y usted no recibi una vacuna que lo proteja contra la hepatitis B (vacuna contra la hepatitis B). ? [place]. ? [place] agujas para inyectarse drogas. ? Vive con alguien que tiene hepatitis B. ? Ha tenido sexo con alguien que tiene hepatitis B. ? Recibe tratamiento de hemodilisis. ? Toma ciertos medicamentos para el cncer, trasplante de rganos y afecciones autoinmunitarias. Hepatitis C  Se recomienda un anlisis de [place] para: ? [employer] 1945 y 1965. ? Todas las personas que tengan un riesgo de haber contrado hepatitis C. Enfermedades de transmisin sexual (ETS).  Debe realizarse pruebas de deteccin de enfermedades de transmisin sexual (ETS), incluidas gonorrea y clamidia si: ? Es sexualmente activo y es menor de 26VZC. ? Es mayor de 24aos, y Investment banker, operational informa que corre riesgo de tener este tipo de infecciones. ? La actividad sexual ha cambiado desde que le hicieron la ltima prueba de deteccin y tiene un riesgo mayor de Best boy clamidia o Radio broadcast assistant. Pregntele al mdico si usted tiene riesgo.  Si no tiene el VIH, pero corre riesgo de infectarse por el virus, se recomienda tomar diariamente un medicamento recetado para evitar la infeccin. Esto se conoce como profilaxis previa a la exposicin. Se considera que est en riesgo  si: ? Es [place] sexualmente y no [place] preservativos habitualmente o no conoce el estado del VIH de sus Advertising copywriter. ? Se inyecta drogas. ? Es [place] sexualmente con Ardelia Mems pareja que tiene VIH. Consulte a su mdico para saber si tiene un alto riesgo de infectarse por el VIH. Si opta por comenzar la profilaxis previa a la exposicin, primero debe realizarse anlisis de deteccin del VIH. Luego, le harn anlisis cada 54mses mientras est tomando los medicamentos para la profilaxis previa a la exposicin. [facility] Si es premenopusica y puede quedar [place] solicite a su mdico asesoramiento previo a la concepcin.  Si puede quedar embarazada, tome 400 a [id number](mcg) de cido fAnheuser-Busch  Si desea evitar el embarazo, hable con su mdico sobre el control de la natalidad (anticoncepcin). OSTEOPOROSIS Y MENOPAUSIA  La osteoporosis es una enfermedad en la que los huesos pierden los minerales y la fuerza por el avance de la edad. El resultado pueden ser fracturas graves en los [place]  de osteoporosis puede identificarse con Ardelia Mems prueba de densidad sea.  Si tiene 65aos o ms, o si est en riesgo de sufrir osteoporosis y fracturas, pregunte a su mdico si debe someterse a exmenes.  Consulte a su mdico si debe tomar un suplemento de calcio o de vitamina D para reducir el riesgo de osteoporosis.  La menopausia puede presentar ciertos sntomas fsicos y [place].  La terapia de reemplazo hormonal puede reducir algunos de estos sntomas y [place]. Consulte a su mdico para saber si la terapia de reemplazo hormonal es conveniente para usted. INSTRUCCIONES PARA EL CUIDADO EN EL HOGAR  Realcese los estudios de rutina de la salud, dentales y de Public librarian.  Las [place].  No consuma ningn producto que contenga tabaco, lo que incluye cigarrillos, tabaco de Higher education careers adviser o Psychologist, sport and exercise.  Si est embarazada, no beba alcohol.  Si est  amamantando, reduzca el consumo de alcohol y la frecuencia con la que consume.  Si es mujer y no est embarazada limite el consumo de alcohol a no ms de 1 medida por da. Una medida equivale a 12onzas de cerveza, 5onzas de vino o 1onzas de bebidas alcohlicas de alta graduacin.  No consuma drogas.  No comparta agujas.  Solicite ayuda a su mdico si necesita apoyo o informacin para abandonar las drogas.  Informe a su mdico si a menudo se siente deprimido.  Notifique a su mdico si alguna vez ha sido vctima de abuso o si no se siente seguro en su hogar. Esta informacin no tiene Marine scientist el consejo del mdico. Asegrese de hacerle al mdico cualquier pregunta que tenga. Document Released: 03/15/2011 Document Revised: 04/16/2014 Document Reviewed: 12/28/2014 Elsevier Interactive Patient Education  [employer].

## 2017-08-21 NOTE — Progress Notes (Signed)
Cheryl [name] Jun 18, 1975 [id number]    History:    Presents for annual exam.  12/2012 Mirena IUD amenorrheic.  Normal Pap and mammogram history.  Past medical history, past surgical history, family history and social history were all reviewed and documented in the EPIC chart.  Factory job active work.  3 children ages 8, 50 and 24 all doing well older 2 have had Gardasil.  Parents healthy.  ROS:  A ROS was performed and pertinent positives and negatives are included.  Exam:  Vitals:   08/21/17 1552  BP: 122/80  Weight: 149 lb (67.6 kg)  Height:  (1.575 m)   Body mass index is 27.25 kg/m.   General appearance:  Normal Thyroid:  Symmetrical, normal in size, without palpable masses or nodularity. Respiratory  Auscultation:  Clear without wheezing or rhonchi Cardiovascular  Auscultation:  Regular rate, without rubs, murmurs or gallops  Edema/varicosities:  Not grossly evident Abdominal  Soft,nontender, without masses, guarding or rebound.  Liver/spleen:  No organomegaly noted  Hernia:  None appreciated  Skin  Inspection:  Grossly normal   Breasts: Examined lying and sitting.     Right: Without masses, retractions, discharge or axillary adenopathy.     Left: Without masses, retractions, discharge or axillary adenopathy. Gentitourinary   Inguinal/mons:  Normal without inguinal adenopathy  External genitalia:  Normal  BUS/Urethra/Skene's glands:  Normal  Vagina:  Normal  Cervix:  Normal  IUD string visible   Uterus:  normal in size, shape and contour.  Midline and mobile  Adnexa/parametria:     Rt: Without masses or tenderness.   Lt: Without masses or tenderness.  Anus and perineum: Normal  Digital rectal exam: Normal sphincter tone without palpated masses or tenderness  Assessment/Plan:  42 y.o. MHF G3 P3 for annual exam with no complaints.  12/2012 Mirena IUD  Plan: Options reviewed, would like to repeat IUD we will check coverage for Dr. Audie Box to remove and  replace 12/2017.  SBE's, continue annual screening mammogram, calcium rich foods, either exercise, vitamin D 1000 daily encouraged.  CBC, CMP, Pap normal 2018, new screening guidelines reviewed.    Harrington Challenger [place], 5:20 PM 08/21/2017

## 2017-08-23 LAB — CULTURE INDICATED

## 2017-08-23 LAB — URINALYSIS, COMPLETE W/RFL CULTURE
Bacteria, UA: NONE SEEN /HPF
Bilirubin Urine: NEGATIVE
GLUCOSE, UA: NEGATIVE
HGB URINE DIPSTICK: NEGATIVE
Hyaline Cast: NONE SEEN /LPF
KETONES UR: NEGATIVE
Nitrites, Initial: NEGATIVE
PH: 6 (ref 5.0–8.0)
Protein, ur: NEGATIVE
RBC / HPF: NONE SEEN /HPF (ref 0–2)
Specific Gravity, Urine: 1.013 (ref 1.001–1.03)
WBC UA: NONE SEEN /HPF (ref 0–5)

## 2017-08-23 LAB — URINE CULTURE
MICRO NUMBER:: 90597414
Result:: NO GROWTH
SPECIMEN QUALITY:: ADEQUATE

## 2017-12-10 ENCOUNTER — Ambulatory Visit: Payer: 59 | Admitting: Gynecology

## 2017-12-24 ENCOUNTER — Encounter: Payer: Self-pay | Admitting: Gynecology

## 2017-12-24 ENCOUNTER — Ambulatory Visit (INDEPENDENT_AMBULATORY_CARE_PROVIDER_SITE_OTHER): Payer: 59 | Admitting: Gynecology

## 2017-12-24 VITALS — BP 120/76

## 2017-12-24 DIAGNOSIS — Z30433 Encounter for removal and reinsertion of intrauterine contraceptive device: Secondary | ICD-10-CM | POA: Diagnosis not present

## 2017-12-24 HISTORY — PX: INTRAUTERINE DEVICE INSERTION: SHX323

## 2017-12-24 NOTE — Progress Notes (Signed)
    Cheryl CoffinDulce Cabrera 1976-04-04 [id number]        42 y.o.  G3P3003  presents for Mirena IUD removal and replacement.  She is at her 5-year mark this month.  She has read through the booklet, has no contraindications and signed the consent form.  I reviewed the removal and insertional process with her as well as the risks to include infection, either immediate or long-term, uterine perforation or migration requiring surgery to remove, other complications such as pain, hormonal side effects and possibility of failure with subsequent pregnancy.   Exam with him Licensed conveyancerGardner assistant Vitals:   12/24/17 1557  BP: 120/76    Pelvic: External BUS vagina normal. Cervix normal with IUD strings visible. Uterus anteverted normal size shape contour midline mobile nontender. Adnexa without masses or tenderness.  Procedure: The cervix was visualized with a speculum and the old Mirena IUD strings were grasped with a [place] forcep, the IUD removed, shown to the patient and discarded.  The cervix was then cleansed with Betadine, anterior lip grasped with a single-tooth tenaculum, the uterus was sounded and a Mirena IUD was placed according to manufacturer's recommendations without difficulty. The strings were trimmed. The patient tolerated well and will follow up in one month for a postinsertional check.  Lot number:  [id number]    Dara Lordsimothy P Lakely Elmendorf MD, 4:17 PM 12/24/2017

## 2017-12-24 NOTE — Patient Instructions (Signed)

## 2018-01-31 ENCOUNTER — Ambulatory Visit: Payer: 59 | Admitting: Gynecology

## 2018-01-31 ENCOUNTER — Encounter: Payer: Self-pay | Admitting: Gynecology

## 2018-01-31 VITALS — BP 118/76

## 2018-01-31 DIAGNOSIS — Z30431 Encounter for routine checking of intrauterine contraceptive device: Secondary | ICD-10-CM

## 2018-01-31 NOTE — Patient Instructions (Signed)
Follow-up in May 2020 when due for annual exam.  Sooner if any issues with the IUD.

## 2018-01-31 NOTE — Progress Notes (Signed)
    Cheryl [name] March 01, 1976 [id number]        42 y.o.  G3P3003 presents for follow-up IUD check.  Had her Mirena IUD switched 12/24/2017.  Has done well since.  Past medical history,surgical history, problem list, medications, allergies, family history and social history were all reviewed and documented in the EPIC chart.  Directed ROS with pertinent positives and negatives documented in the history of present illness/assessment and plan.  Exam: Cheryl [name] assistant Vitals:   01/31/18 1520  BP: 118/76   General appearance:  Normal Abdomen soft nontender without masses guarding rebound Pelvic external BUS vagina normal.  Cervix normal with IUD string visualized and appropriate length.  Uterus normal size midline mobile nontender.  Adnexa without masses or tenderness.  Assessment/Plan:  42 y.o. [name] with normal follow-up IUD check.  Doing well.  We will follow-up in May when due for annual exam, sooner if any issues.    Cheryl Lords MD, 3:29 PM 01/31/2018

## 2018-06-25 ENCOUNTER — Other Ambulatory Visit: Payer: Self-pay | Admitting: Gynecology

## 2018-06-25 DIAGNOSIS — Z1231 Encounter for screening mammogram for malignant neoplasm of breast: Secondary | ICD-10-CM

## 2018-07-28 ENCOUNTER — Ambulatory Visit: Payer: 59

## 2018-09-11 ENCOUNTER — Ambulatory Visit
Admission: RE | Admit: 2018-09-11 | Discharge: 2018-09-11 | Disposition: A | Discharge status: Home / Self Care | Payer: 59 | Source: Ambulatory Visit | Attending: Gynecology | Admitting: Gynecology

## 2018-09-11 ENCOUNTER — Other Ambulatory Visit: Payer: Self-pay

## 2018-09-11 DIAGNOSIS — Z1231 Encounter for screening mammogram for malignant neoplasm of breast: Secondary | ICD-10-CM

## 2018-10-07 ENCOUNTER — Other Ambulatory Visit: Payer: Self-pay

## 2018-10-08 ENCOUNTER — Ambulatory Visit: Payer: 59 | Admitting: Gynecology

## 2018-10-08 ENCOUNTER — Encounter: Payer: Self-pay | Admitting: Gynecology

## 2018-10-08 VITALS — BP 116/74 | Ht 62.0 in | Wt 143.0 lb

## 2018-10-08 DIAGNOSIS — Z1151 Encounter for screening for human papillomavirus (HPV): Secondary | ICD-10-CM

## 2018-10-08 DIAGNOSIS — N898 Other specified noninflammatory disorders of vagina: Secondary | ICD-10-CM | POA: Diagnosis not present

## 2018-10-08 DIAGNOSIS — N930 Postcoital and contact bleeding: Secondary | ICD-10-CM | POA: Diagnosis not present

## 2018-10-08 DIAGNOSIS — Z30431 Encounter for routine checking of intrauterine contraceptive device: Secondary | ICD-10-CM | POA: Diagnosis not present

## 2018-10-08 DIAGNOSIS — Z01419 Encounter for gynecological examination (general) (routine) without abnormal findings: Secondary | ICD-10-CM | POA: Diagnosis not present

## 2018-10-08 LAB — WET PREP FOR TRICH, YEAST, CLUE

## 2018-10-08 MED ORDER — METRONIDAZOLE 500 MG PO TABS: 500.0000 mg | ORAL_TABLET | Freq: Two times a day (BID) | ORAL | 0 refills | Status: AC

## 2018-10-08 MED ORDER — FLUCONAZOLE 150 MG PO TABS: 150.0000 mg | ORAL_TABLET | Freq: Once | ORAL | 0 refills | Status: AC

## 2018-10-08 NOTE — Addendum Note (Signed)
Addended by: Nelva Nay on: 10/08/2018 04:40 PM   Modules accepted: Orders

## 2018-10-08 NOTE — Progress Notes (Signed)
    Cheryl [name] [birth year] [id number]        43 y.o.  [id number] for annual gynecologic exam.  Notes some spotting after intercourse intermittently although not consistently.  Not really having pain with that.  Has Mirena IUD placed 12/2017 and doing well otherwise without menses.  Past medical history,surgical history, problem list, medications, allergies, family history and social history were all reviewed and documented as reviewed in the EPIC chart.  ROS:  Performed with pertinent positives and negatives included in the history, assessment and plan.   Additional significant findings : None   Exam: Caryn Bee assistant Vitals:   10/08/18 1558  BP: 116/74  Weight: 143 lb (64.9 kg)  Height: 5\' 2"  (1.575 m)   Body mass index is 26.16 kg/m.  General appearance:  Normal affect, orientation and appearance. Skin: Grossly normal HEENT: Without gross lesions.  No cervical or supraclavicular adenopathy. Thyroid normal.  Lungs:  Clear without wheezing, rales or rhonchi Cardiac: RR, without RMG Abdominal:  Soft, nontender, without masses, guarding, rebound, organomegaly or hernia Breasts:  Examined lying and sitting without masses, retractions, discharge or axillary adenopathy. Pelvic:  Ext, BUS, Vagina: Normal  Cervix: Normal.  IUD string visualized.  Pap/HPV  Uterus: Anteverted, normal size, shape and contour, midline and mobile nontender   Adnexa: Without masses or tenderness    Anus and perineum: Normal   Rectovaginal: Normal sphincter tone without palpated masses or tenderness.    Assessment/Plan:  43 y.o. G48P3003 female for annual gynecologic exam.  Without menses, Mirena IUD  1. Postcoital spotting.  Wet prep was positive for yeast and BV.  Not having history of discharge irritation or odor.  I suspect this is accounting for her spotting and I covered her with Diflucan 150 mg x 1 dose and Flagyl 500 mg twice daily x7 days, alcohol avoidance discussed and accepted. 2. Mirena IUD  12/2017.  Doing well with this.  IUD string visualized. 3. Mammography 09/2018.  Breast exam normal today.  Continue with annual mammography next year. 4. Pap smear 2018.  Pap smear/HPV today.  No history of significant abnormal Pap smears. 5. Health maintenance.  No routine lab work done as patient states that she has an appointment with her primary physician coming up and she will have it done there.  Follow-up 1 year, sooner as needed.   Anastasio Auerbach MD, 4:35 PM 10/08/2018

## 2018-10-08 NOTE — Patient Instructions (Addendum)
Take the one Diflucan pill to treat the yeast infection.  Take the metronidazole antibiotic twice daily for 7 days.  Do not drink alcohol while taking.  Follow-up if the bleeding after intercourse continues.  Follow-up in 1 year for annual exam

## 2018-10-09 LAB — PAP IG AND HPV HIGH-RISK: HPV DNA High Risk: NOT DETECTED

## 2018-12-30 ENCOUNTER — Encounter: Payer: Self-pay | Admitting: Gynecology

## 2019-08-10 ENCOUNTER — Other Ambulatory Visit: Payer: Self-pay | Admitting: Gynecology

## 2019-08-10 DIAGNOSIS — Z1231 Encounter for screening mammogram for malignant neoplasm of breast: Secondary | ICD-10-CM

## 2019-09-18 ENCOUNTER — Other Ambulatory Visit: Payer: Self-pay

## 2019-09-18 ENCOUNTER — Ambulatory Visit
Admission: RE | Admit: 2019-09-18 | Discharge: 2019-09-18 | Disposition: A | Discharge status: Home / Self Care | Payer: 59 | Source: Ambulatory Visit | Attending: Optometry | Admitting: Optometry

## 2019-09-18 DIAGNOSIS — Z1231 Encounter for screening mammogram for malignant neoplasm of breast: Secondary | ICD-10-CM

## 2020-09-20 ENCOUNTER — Other Ambulatory Visit: Payer: Self-pay | Admitting: Internal Medicine

## 2020-09-20 DIAGNOSIS — Z1231 Encounter for screening mammogram for malignant neoplasm of breast: Secondary | ICD-10-CM

## 2020-09-23 ENCOUNTER — Other Ambulatory Visit: Payer: Self-pay

## 2020-09-23 ENCOUNTER — Ambulatory Visit
Admission: RE | Admit: 2020-09-23 | Discharge: 2020-09-23 | Disposition: A | Discharge status: Home / Self Care | Payer: 59 | Source: Ambulatory Visit | Attending: Internal Medicine | Admitting: Internal Medicine

## 2020-09-23 DIAGNOSIS — Z1231 Encounter for screening mammogram for malignant neoplasm of breast: Secondary | ICD-10-CM

## 2020-09-27 ENCOUNTER — Other Ambulatory Visit: Payer: Self-pay | Admitting: Internal Medicine

## 2020-09-27 DIAGNOSIS — R928 Other abnormal and inconclusive findings on diagnostic imaging of breast: Secondary | ICD-10-CM

## 2020-10-19 ENCOUNTER — Other Ambulatory Visit: Payer: Self-pay

## 2020-10-19 ENCOUNTER — Ambulatory Visit: Payer: 59

## 2020-10-19 ENCOUNTER — Ambulatory Visit
Admission: RE | Admit: 2020-10-19 | Discharge: 2020-10-19 | Disposition: A | Discharge status: Home / Self Care | Payer: 59 | Source: Ambulatory Visit | Attending: Internal Medicine | Admitting: Internal Medicine

## 2020-10-19 DIAGNOSIS — R928 Other abnormal and inconclusive findings on diagnostic imaging of breast: Secondary | ICD-10-CM

## 2021-09-18 ENCOUNTER — Other Ambulatory Visit: Payer: Self-pay | Admitting: Internal Medicine

## 2021-09-18 DIAGNOSIS — Z1231 Encounter for screening mammogram for malignant neoplasm of breast: Secondary | ICD-10-CM

## 2021-09-28 ENCOUNTER — Ambulatory Visit
Admission: RE | Admit: 2021-09-28 | Discharge: 2021-09-28 | Disposition: A | Discharge status: Home / Self Care | Payer: 59 | Source: Ambulatory Visit | Attending: Internal Medicine | Admitting: Internal Medicine

## 2021-09-28 DIAGNOSIS — Z1231 Encounter for screening mammogram for malignant neoplasm of breast: Secondary | ICD-10-CM

## 2022-09-28 ENCOUNTER — Other Ambulatory Visit: Payer: Self-pay | Admitting: Internal Medicine

## 2022-09-28 DIAGNOSIS — Z1231 Encounter for screening mammogram for malignant neoplasm of breast: Secondary | ICD-10-CM

## 2022-10-19 ENCOUNTER — Ambulatory Visit
Admission: RE | Admit: 2022-10-19 | Discharge: 2022-10-19 | Disposition: A | Discharge status: Home / Self Care | Payer: 59 | Source: Ambulatory Visit | Attending: Internal Medicine | Admitting: Internal Medicine

## 2022-10-19 DIAGNOSIS — Z1231 Encounter for screening mammogram for malignant neoplasm of breast: Secondary | ICD-10-CM

## 2024-06-17 IMAGING — MG MM DIGITAL SCREENING BILAT W/ TOMO AND CAD
8 series · 9 of 24 positions shown · non-contrast
Comparison: Previous exam(s).

CLINICAL DATA: Screening.

EXAM:
DIGITAL SCREENING BILATERAL MAMMOGRAM WITH TOMOSYNTHESIS AND CAD
TECHNIQUE: Bilateral screening digital craniocaudal and mediolateral oblique
mammograms were obtained. Bilateral screening digital breast
tomosynthesis was performed. The images were evaluated with
computer-aided detection.

[R MLO synth-2D]
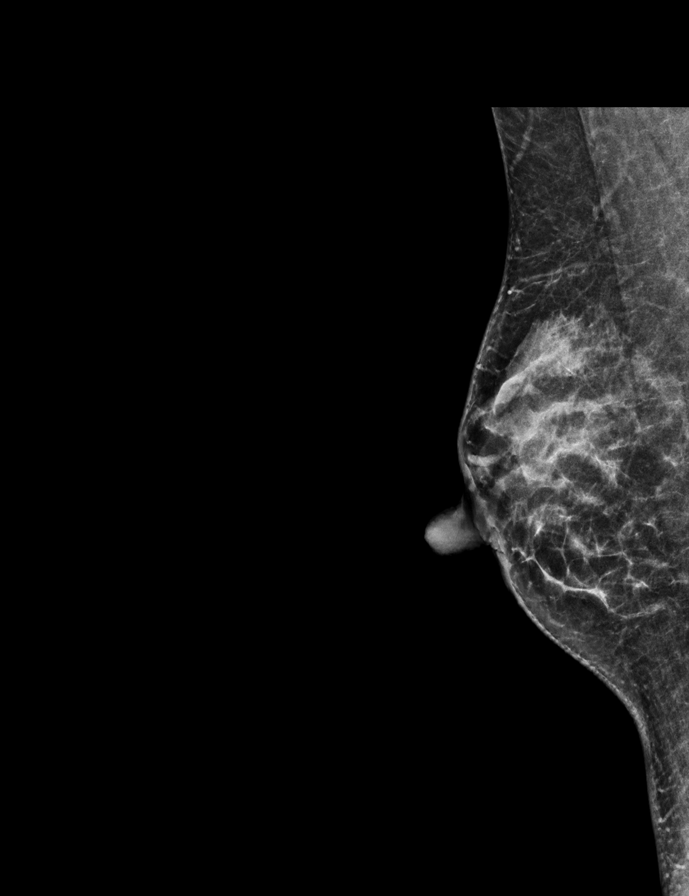

[R CC synth-2D]
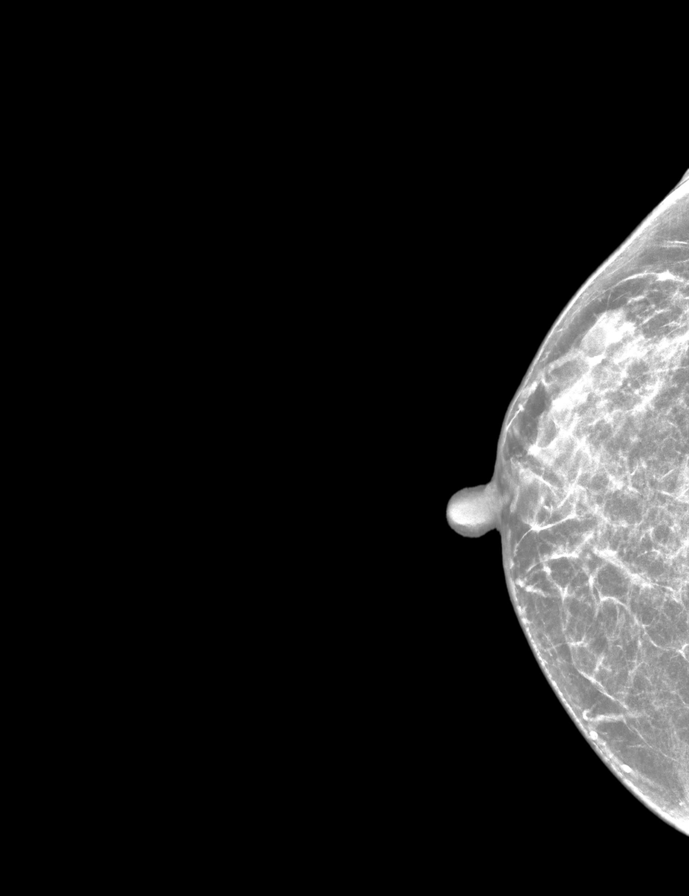

[L CC synth-2D]
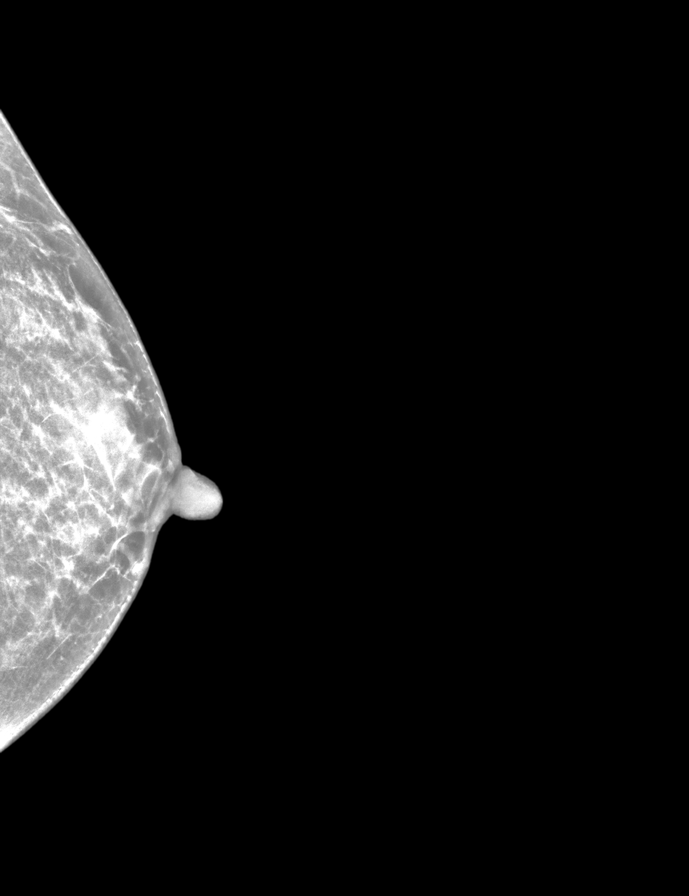

[L MLO synth-2D]
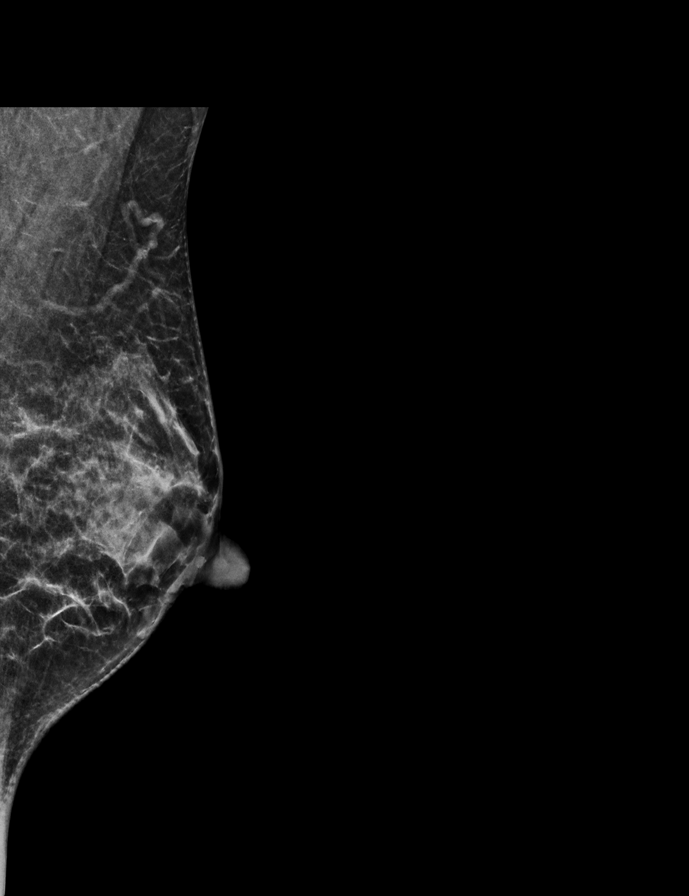

[R CC tomo · 2 of 50 frames shown]
[frame 17/50]
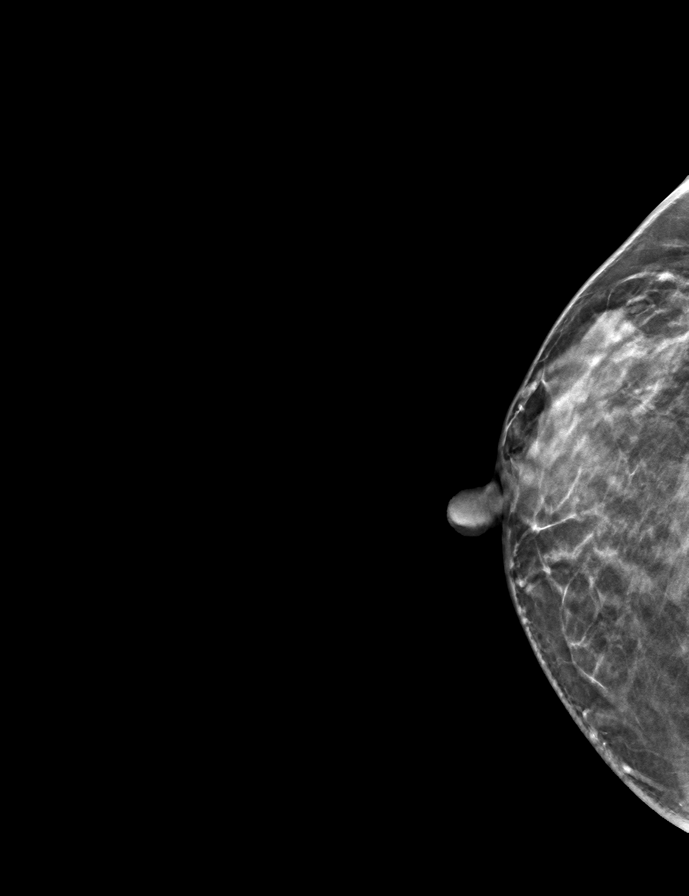
[frame 25/50]
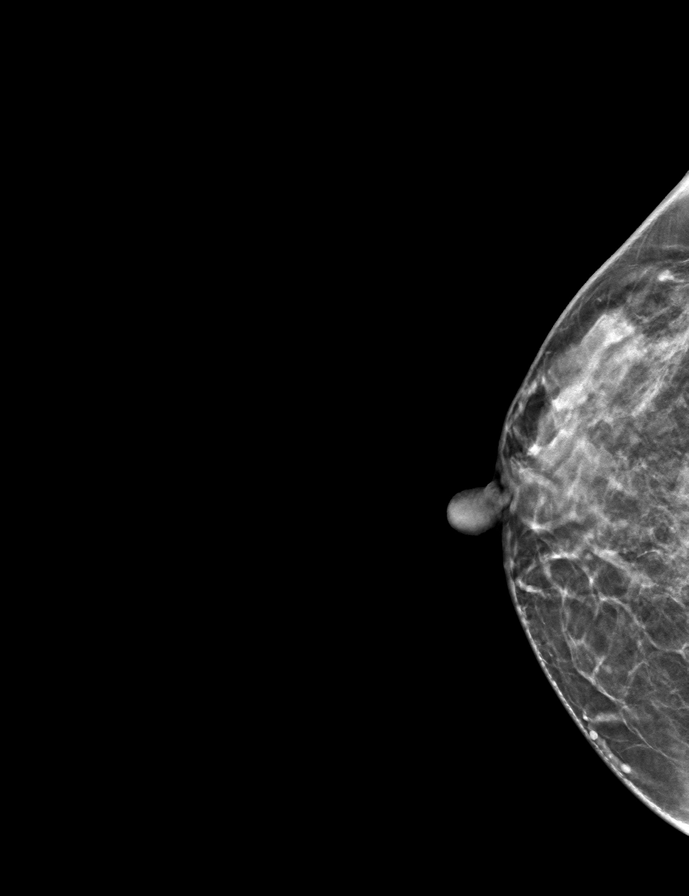

[R MLO tomo · tomo slice 27/53.0]
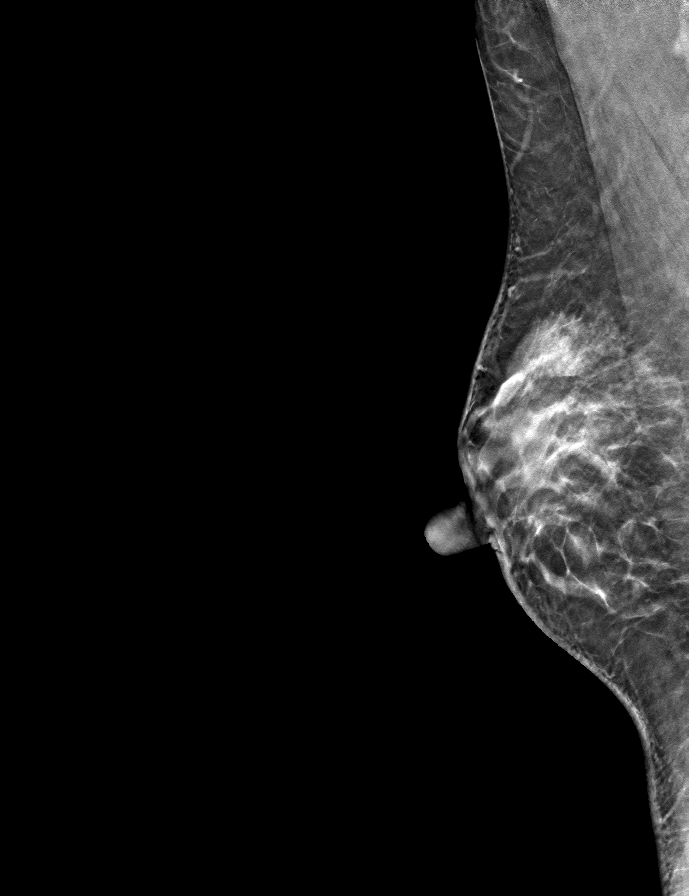

[L CC tomo · tomo slice 25/50.0]
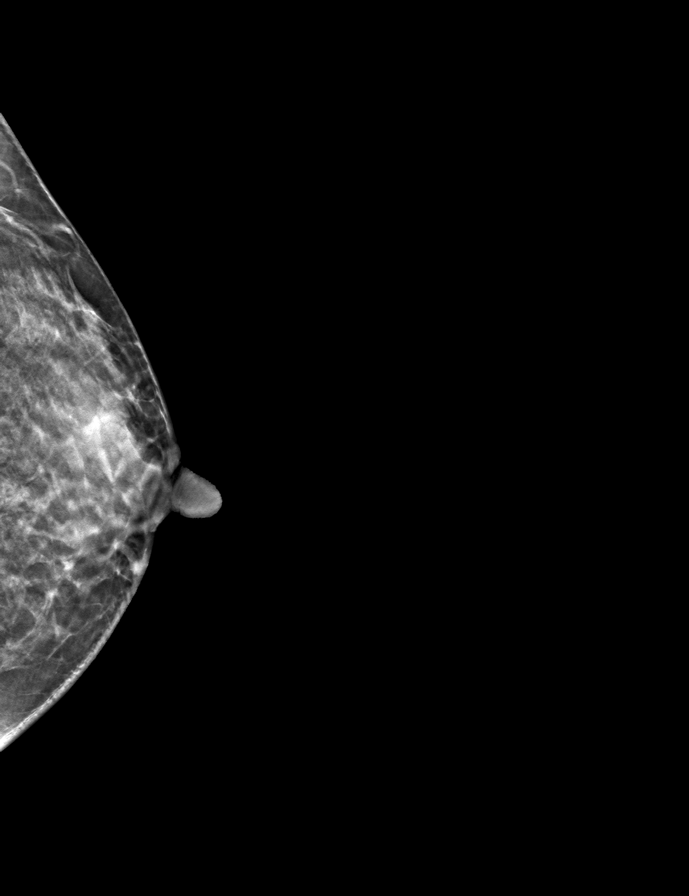

[L MLO tomo · tomo slice 26/51.0]
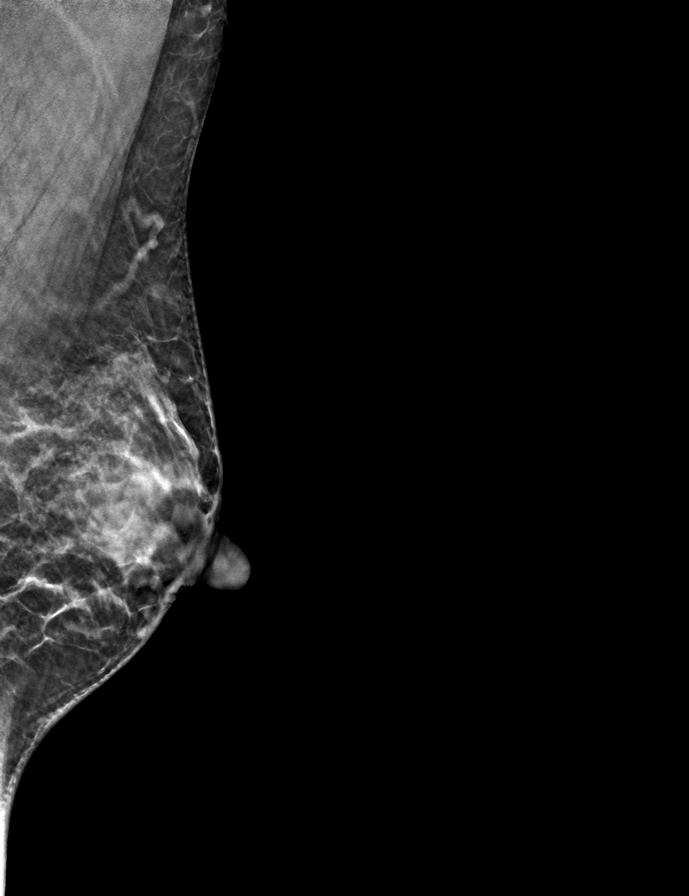

[9 of 24 positions shown; findings below may reference images not displayed]

ACR Breast Density Category c: The breast tissue is heterogeneously
dense, which may obscure small masses.
FINDINGS: There are no findings suspicious for malignancy.
IMPRESSION: No mammographic evidence of malignancy. A result letter of this
screening mammogram will be mailed directly to the patient.

RECOMMENDATION:
Screening mammogram in one year. (Code:Q3-W-BC3)

BI-RADS CATEGORY  1: Negative.
